# Patient Record
Sex: Male | Born: 1950 | Race: Black or African American | Hispanic: No | Marital: Married | Smoking: Never smoker
Health system: Southern US, Community
[De-identification: ages and names within clinical notes are randomized; demographics above are authoritative.]

## PROBLEM LIST (undated history)

## (undated) DIAGNOSIS — I10 Essential (primary) hypertension: Secondary | ICD-10-CM

## (undated) DIAGNOSIS — E119 Type 2 diabetes mellitus without complications: Secondary | ICD-10-CM

## (undated) DIAGNOSIS — E785 Hyperlipidemia, unspecified: Secondary | ICD-10-CM

## (undated) DIAGNOSIS — R972 Elevated prostate specific antigen [PSA]: Secondary | ICD-10-CM

## (undated) HISTORY — PX: OTHER SURGICAL HISTORY: SHX169

---

## 2002-04-17 ENCOUNTER — Emergency Department (HOSPITAL_COMMUNITY): Admission: EM | Admit: 2002-04-17 | Discharge: 2002-04-17 | Payer: Self-pay | Admitting: Emergency Medicine

## 2011-10-20 ENCOUNTER — Other Ambulatory Visit: Payer: Self-pay | Admitting: Specialist

## 2011-10-20 DIAGNOSIS — M25511 Pain in right shoulder: Secondary | ICD-10-CM

## 2011-10-21 ENCOUNTER — Ambulatory Visit
Admission: RE | Admit: 2011-10-21 | Discharge: 2011-10-21 | Disposition: A | Discharge status: Home / Self Care | Payer: BC Managed Care – PPO | Source: Ambulatory Visit | Attending: Specialist | Admitting: Specialist

## 2011-10-21 ENCOUNTER — Other Ambulatory Visit: Payer: Self-pay | Admitting: Specialist

## 2011-10-21 DIAGNOSIS — M25511 Pain in right shoulder: Secondary | ICD-10-CM

## 2016-02-17 DIAGNOSIS — E1165 Type 2 diabetes mellitus with hyperglycemia: Secondary | ICD-10-CM | POA: Diagnosis not present

## 2016-02-17 DIAGNOSIS — E119 Type 2 diabetes mellitus without complications: Secondary | ICD-10-CM | POA: Diagnosis not present

## 2016-02-17 DIAGNOSIS — I119 Hypertensive heart disease without heart failure: Secondary | ICD-10-CM | POA: Diagnosis not present

## 2016-02-17 DIAGNOSIS — N400 Enlarged prostate without lower urinary tract symptoms: Secondary | ICD-10-CM | POA: Diagnosis not present

## 2016-03-04 DIAGNOSIS — F4320 Adjustment disorder, unspecified: Secondary | ICD-10-CM | POA: Diagnosis not present

## 2016-04-08 DIAGNOSIS — F4320 Adjustment disorder, unspecified: Secondary | ICD-10-CM | POA: Diagnosis not present

## 2016-07-30 DIAGNOSIS — H524 Presbyopia: Secondary | ICD-10-CM | POA: Diagnosis not present

## 2016-09-08 DIAGNOSIS — F4320 Adjustment disorder, unspecified: Secondary | ICD-10-CM | POA: Diagnosis not present

## 2017-06-29 DIAGNOSIS — E119 Type 2 diabetes mellitus without complications: Secondary | ICD-10-CM | POA: Diagnosis not present

## 2017-06-29 DIAGNOSIS — I119 Hypertensive heart disease without heart failure: Secondary | ICD-10-CM | POA: Diagnosis not present

## 2017-06-29 DIAGNOSIS — Z794 Long term (current) use of insulin: Secondary | ICD-10-CM | POA: Diagnosis not present

## 2017-08-03 DIAGNOSIS — T8140XA Infection following a procedure, unspecified, initial encounter: Secondary | ICD-10-CM | POA: Diagnosis not present

## 2017-10-12 DIAGNOSIS — I119 Hypertensive heart disease without heart failure: Secondary | ICD-10-CM | POA: Diagnosis not present

## 2017-10-12 DIAGNOSIS — R739 Hyperglycemia, unspecified: Secondary | ICD-10-CM | POA: Diagnosis not present

## 2017-10-12 DIAGNOSIS — E119 Type 2 diabetes mellitus without complications: Secondary | ICD-10-CM | POA: Diagnosis not present

## 2018-05-17 DIAGNOSIS — D485 Neoplasm of uncertain behavior of skin: Secondary | ICD-10-CM | POA: Diagnosis not present

## 2018-05-17 DIAGNOSIS — L980 Pyogenic granuloma: Secondary | ICD-10-CM | POA: Diagnosis not present

## 2018-06-14 DIAGNOSIS — L980 Pyogenic granuloma: Secondary | ICD-10-CM | POA: Diagnosis not present

## 2018-06-14 DIAGNOSIS — D485 Neoplasm of uncertain behavior of skin: Secondary | ICD-10-CM | POA: Diagnosis not present

## 2018-07-12 DIAGNOSIS — R739 Hyperglycemia, unspecified: Secondary | ICD-10-CM | POA: Diagnosis not present

## 2018-07-12 DIAGNOSIS — E119 Type 2 diabetes mellitus without complications: Secondary | ICD-10-CM | POA: Diagnosis not present

## 2018-07-12 DIAGNOSIS — I119 Hypertensive heart disease without heart failure: Secondary | ICD-10-CM | POA: Diagnosis not present

## 2018-07-13 DIAGNOSIS — E034 Atrophy of thyroid (acquired): Secondary | ICD-10-CM | POA: Diagnosis not present

## 2018-07-13 DIAGNOSIS — E7849 Other hyperlipidemia: Secondary | ICD-10-CM | POA: Diagnosis not present

## 2018-07-13 DIAGNOSIS — E119 Type 2 diabetes mellitus without complications: Secondary | ICD-10-CM | POA: Diagnosis not present

## 2018-07-13 DIAGNOSIS — I10 Essential (primary) hypertension: Secondary | ICD-10-CM | POA: Diagnosis not present

## 2019-02-07 DIAGNOSIS — I119 Hypertensive heart disease without heart failure: Secondary | ICD-10-CM | POA: Diagnosis not present

## 2019-02-07 DIAGNOSIS — E119 Type 2 diabetes mellitus without complications: Secondary | ICD-10-CM | POA: Diagnosis not present

## 2019-02-07 DIAGNOSIS — R739 Hyperglycemia, unspecified: Secondary | ICD-10-CM | POA: Diagnosis not present

## 2019-02-08 DIAGNOSIS — E034 Atrophy of thyroid (acquired): Secondary | ICD-10-CM | POA: Diagnosis not present

## 2019-02-08 DIAGNOSIS — E119 Type 2 diabetes mellitus without complications: Secondary | ICD-10-CM | POA: Diagnosis not present

## 2019-02-08 DIAGNOSIS — I10 Essential (primary) hypertension: Secondary | ICD-10-CM | POA: Diagnosis not present

## 2019-02-08 DIAGNOSIS — E7849 Other hyperlipidemia: Secondary | ICD-10-CM | POA: Diagnosis not present

## 2019-04-25 ENCOUNTER — Other Ambulatory Visit: Payer: Self-pay

## 2019-08-21 ENCOUNTER — Other Ambulatory Visit: Payer: Self-pay

## 2019-09-05 DIAGNOSIS — E119 Type 2 diabetes mellitus without complications: Secondary | ICD-10-CM | POA: Diagnosis not present

## 2019-09-05 DIAGNOSIS — I119 Hypertensive heart disease without heart failure: Secondary | ICD-10-CM | POA: Diagnosis not present

## 2019-09-05 DIAGNOSIS — R739 Hyperglycemia, unspecified: Secondary | ICD-10-CM | POA: Diagnosis not present

## 2019-09-05 DIAGNOSIS — D638 Anemia in other chronic diseases classified elsewhere: Secondary | ICD-10-CM | POA: Diagnosis not present

## 2019-09-05 DIAGNOSIS — Z0000 Encounter for general adult medical examination without abnormal findings: Secondary | ICD-10-CM | POA: Diagnosis not present

## 2020-03-05 ENCOUNTER — Ambulatory Visit: Payer: Self-pay | Admitting: Internal Medicine

## 2020-05-21 ENCOUNTER — Ambulatory Visit: Payer: Self-pay | Admitting: Internal Medicine

## 2020-05-28 ENCOUNTER — Other Ambulatory Visit: Payer: Self-pay

## 2020-05-28 MED ORDER — LOSARTAN POTASSIUM-HCTZ 100-12.5 MG PO TABS: 1.0000 | ORAL_TABLET | Freq: Every day | ORAL | 3 refills | Status: DC

## 2020-07-16 ENCOUNTER — Ambulatory Visit: Payer: Self-pay | Admitting: Family Medicine

## 2020-08-13 ENCOUNTER — Ambulatory Visit: Payer: Self-pay | Admitting: Family Medicine

## 2020-08-18 ENCOUNTER — Encounter: Payer: Self-pay | Admitting: Pharmacist

## 2020-09-04 ENCOUNTER — Other Ambulatory Visit: Payer: Self-pay | Admitting: *Deleted

## 2020-09-04 MED ORDER — GLIMEPIRIDE 4 MG PO TABS: 8.0000 mg | ORAL_TABLET | Freq: Every day | ORAL | 3 refills | Status: DC

## 2020-09-10 ENCOUNTER — Ambulatory Visit (INDEPENDENT_AMBULATORY_CARE_PROVIDER_SITE_OTHER): Payer: PPO | Admitting: Family Medicine

## 2020-09-10 ENCOUNTER — Encounter: Payer: Self-pay | Admitting: Family Medicine

## 2020-09-10 ENCOUNTER — Other Ambulatory Visit: Payer: Self-pay

## 2020-09-10 VITALS — BP 153/81 | HR 83 | Ht 72.0 in | Wt 169.9 lb

## 2020-09-10 DIAGNOSIS — Z0000 Encounter for general adult medical examination without abnormal findings: Secondary | ICD-10-CM | POA: Diagnosis not present

## 2020-09-10 DIAGNOSIS — Z1211 Encounter for screening for malignant neoplasm of colon: Secondary | ICD-10-CM

## 2020-09-10 DIAGNOSIS — Z23 Encounter for immunization: Secondary | ICD-10-CM | POA: Diagnosis not present

## 2020-09-10 DIAGNOSIS — S60512A Abrasion of left hand, initial encounter: Secondary | ICD-10-CM

## 2020-09-10 DIAGNOSIS — E119 Type 2 diabetes mellitus without complications: Secondary | ICD-10-CM | POA: Insufficient documentation

## 2020-09-10 DIAGNOSIS — I10 Essential (primary) hypertension: Secondary | ICD-10-CM | POA: Diagnosis not present

## 2020-09-10 DIAGNOSIS — R972 Elevated prostate specific antigen [PSA]: Secondary | ICD-10-CM | POA: Diagnosis not present

## 2020-09-10 DIAGNOSIS — E7849 Other hyperlipidemia: Secondary | ICD-10-CM

## 2020-09-10 DIAGNOSIS — E785 Hyperlipidemia, unspecified: Secondary | ICD-10-CM | POA: Insufficient documentation

## 2020-09-10 LAB — GLUCOSE, POCT (MANUAL RESULT ENTRY): POC Glucose: 226 mg/dl — AB (ref 70–99)

## 2020-09-10 MED ORDER — METFORMIN HCL 500 MG PO TABS: 500.0000 mg | ORAL_TABLET | Freq: Two times a day (BID) | ORAL | 3 refills | Status: DC

## 2020-09-10 MED ORDER — ROSUVASTATIN CALCIUM 10 MG PO TABS: 10.0000 mg | ORAL_TABLET | Freq: Every day | ORAL | 3 refills | Status: DC

## 2020-09-10 MED ORDER — LOSARTAN POTASSIUM-HCTZ 100-12.5 MG PO TABS: 1.0000 | ORAL_TABLET | Freq: Every day | ORAL | 3 refills | Status: DC

## 2020-09-10 MED ORDER — FINASTERIDE 5 MG PO TABS: 5.0000 mg | ORAL_TABLET | Freq: Every day | ORAL | 3 refills | Status: DC

## 2020-09-10 NOTE — Assessment & Plan Note (Signed)
Crestor 10 mg ordered today, mild lipidemia but with multiple co morbidities treatment initiated.

## 2020-09-10 NOTE — Assessment & Plan Note (Signed)
Colon Screening- Will order Cologuard PSA- 2020 abnormal doing it today TDAP- Doing today 2021 Shingles Vaccine- Declined Flu Vaccine- 2021 Pneumonia Vaccine- Declined

## 2020-09-10 NOTE — Assessment & Plan Note (Signed)
Mild abrasion left hand, unsure of date of injury, updating TDAP today

## 2020-09-10 NOTE — Assessment & Plan Note (Addendum)
Diabetes mellitus Type II, under excellent control.. Continued metformin; see  medication orders.  A1C done today, diet discussed, statin therapy initiated at Crestor 10 mg.

## 2020-09-10 NOTE — Progress Notes (Signed)
Established Patient Office Visit  SUBJECTIVE:  Subjective  Patient ID: Lucas Collins, male    DOB: [birth year]  Age: 69 y.o. MRN: [id number]  CC:  Chief Complaint  Patient presents with  . Diabetes    HPI Lucas Collins is a 69 y.o. male presenting today for Annual exam, PSA, DM and HTN management.   History reviewed. No pertinent past medical history.  History reviewed. No pertinent surgical history.  History reviewed. No pertinent family history.  Social History   Socioeconomic History  . Marital status: Married    Spouse name: Not on file  . Number of children: Not on file  . Years of education: Not on file  . Highest education level: Not on file  Occupational History  . Not on file  Tobacco Use  . Smoking status: Never Smoker  . Smokeless tobacco: Never Used  Substance and Sexual Activity  . Alcohol use: Never  . Drug use: Never  . Sexual activity: Yes  Other Topics Concern  . Not on file  Social History Narrative  . Not on file   Social Determinants of Health   Financial Resource Strain: Not on file  Food Insecurity: Not on file  Transportation Needs: Not on file  Physical Activity: Not on file  Stress: Not on file  Social Connections: Not on file  Intimate Partner Violence: Not on file     Current Outpatient Medications:  .  finasteride (PROSCAR) 5 MG tablet, Take 1 tablet (5 mg total) by mouth daily., Disp: 90 tablet, Rfl: 3 .  glimepiride (AMARYL) 4 MG tablet, Take 2 tablets (8 mg total) by mouth daily., Disp: 90 tablet, Rfl: 3 .  losartan-hydrochlorothiazide (HYZAAR) 100-12.5 MG tablet, Take 1 tablet by mouth daily., Disp: 90 tablet, Rfl: 3 .  metFORMIN (GLUCOPHAGE) 500 MG tablet, Take 1 tablet (500 mg total) by mouth 2 (two) times daily., Disp: 180 tablet, Rfl: 3 .  rosuvastatin (CRESTOR) 10 MG tablet, Take 1 tablet (10 mg total) by mouth daily., Disp: 90 tablet, Rfl: 3   Not on File  ROS Review of Systems  Constitutional: Negative.    Respiratory: Negative.   Cardiovascular: Negative.   Genitourinary: Negative.   Skin: Negative.   Neurological: Negative.   Psychiatric/Behavioral: Negative.      OBJECTIVE:    Physical Exam Constitutional:      Appearance: Normal appearance.  HENT:     Right Ear: Tympanic membrane normal.     Left Ear: Tympanic membrane normal.     Mouth/Throat:     Mouth: Mucous membranes are moist.  Eyes:     Pupils: Pupils are equal, round, and reactive to light.  Cardiovascular:     Rate and Rhythm: Normal rate.  Pulmonary:     Effort: Pulmonary effort is normal.  Musculoskeletal:        General: Normal range of motion.     Cervical back: Normal range of motion.  Skin:    General: Skin is warm.  Neurological:     Mental Status: He is alert.  Psychiatric:        Mood and Affect: Mood normal.     BP (!) 153/81   Pulse 83   Ht 6' (1.829 m)   Wt 169 lb 14.4 oz (77.1 kg)   BMI 23.04 kg/m  Wt Readings from Last 3 Encounters:  09/10/20 169 lb 14.4 oz (77.1 kg)    Health Maintenance Due  Topic Date Due  . HEMOGLOBIN A1C  Never done  .  Hepatitis C Screening  Never done  . FOOT EXAM  Never done  . OPHTHALMOLOGY EXAM  Never done  . COVID-19 Vaccine (1) Never done  . TETANUS/TDAP  Never done  . COLONOSCOPY  Never done  . PNA vac Low Risk Adult (1 of 2 - PCV13) Never done  . INFLUENZA VACCINE  04/26/2020    There are no preventive care reminders to display for this patient.  No flowsheet data found. No flowsheet data found.  No results found for: TSH No results found for: ALBUMIN, ANIONGAP, EGFR, GFR No results found for: CHOL, HDL, LDLCALC, CHOLHDL No results found for: TRIG No results found for: HGBA1C    ASSESSMENT & PLAN:   Problem List Items Addressed This Visit      Cardiovascular and Mediastinum   Primary hypertension   Relevant Medications   losartan-hydrochlorothiazide (HYZAAR) 100-12.5 MG tablet   rosuvastatin (CRESTOR) 10 MG tablet   Other Relevant  Orders   CBC with Differential/Platelet   COMPLETE METABOLIC PANEL WITH GFR     Endocrine   Type 2 diabetes mellitus without complication, without long-term current use of insulin (HCC)    Diabetes mellitus Type II, under excellent control.. Continued metformin; see  medication orders.  A1C done today, diet discussed, statin therapy initiated at Crestor 10 mg.        Relevant Medications   losartan-hydrochlorothiazide (HYZAAR) 100-12.5 MG tablet   metFORMIN (GLUCOPHAGE) 500 MG tablet   rosuvastatin (CRESTOR) 10 MG tablet   Other Relevant Orders   POCT glucose (manual entry) (Completed)   Hemoglobin A1c     Musculoskeletal and Integument   Abrasion of left hand    Mild abrasion left hand, unsure of date of injury, updating TDAP today      Relevant Orders   Tdap vaccine greater than or equal to 7yo IM     Other   Annual physical exam - Primary    Colon Screening- Will order Cologuard PSA- 2020 abnormal doing it today TDAP- Doing today 2021 Shingles Vaccine- Declined Flu Vaccine- 2021 Pneumonia Vaccine- Declined       Relevant Orders   CBC with Differential/Platelet   COMPLETE METABOLIC PANEL WITH GFR   Lipid panel   Hemoglobin A1c   PSA   Elevated PSA    Last PSA 18 months ago was 10.4, he has consulted with Urology then and biopsy was not suggested at that time. Plan- PSA done today, he will call Urology for a f/u.       Relevant Orders   PSA    Other Visit Diagnoses    Need for influenza vaccination       Relevant Orders   Flu Vaccine QUAD High Dose(Fluad)      Meds ordered this encounter  Medications  . losartan-hydrochlorothiazide (HYZAAR) 100-12.5 MG tablet    Sig: Take 1 tablet by mouth daily.    Dispense:  90 tablet    Refill:  3  . finasteride (PROSCAR) 5 MG tablet    Sig: Take 1 tablet (5 mg total) by mouth daily.    Dispense:  90 tablet    Refill:  3  . metFORMIN (GLUCOPHAGE) 500 MG tablet    Sig: Take 1 tablet (500 mg total) by mouth 2  (two) times daily.    Dispense:  180 tablet    Refill:  3  . rosuvastatin (CRESTOR) 10 MG tablet    Sig: Take 1 tablet (10 mg total) by mouth daily.    Dispense:  90 tablet    Refill:  3      Follow-up: No follow-ups on file.    Beckie Salts, [place] [address] [place], [place] [address]

## 2020-09-10 NOTE — Assessment & Plan Note (Signed)
Not at goal today, will maintain current therapy and return for re evaluation

## 2020-09-10 NOTE — Assessment & Plan Note (Signed)
Last PSA 18 months ago was 10.4, he has consulted with Urology then and biopsy was not suggested at that time. Plan- PSA done today, he will call Urology for a f/u.

## 2020-09-11 LAB — CBC WITH DIFFERENTIAL/PLATELET
Absolute Monocytes: 353 cells/uL (ref 200–950)
Basophils Absolute: 30 cells/uL (ref 0–200)
Basophils Relative: 0.4 %
Eosinophils Absolute: 68 cells/uL (ref 15–500)
Eosinophils Relative: 0.9 %
HCT: 36.5 % — ABNORMAL LOW (ref 38.5–50.0)
Hemoglobin: 11.9 g/dL — ABNORMAL LOW (ref 13.2–17.1)
Lymphs Abs: 1290 cells/uL (ref 850–3900)
MCH: 29.2 pg (ref 27.0–33.0)
MCHC: 32.6 g/dL (ref 32.0–36.0)
MCV: 89.5 fL (ref 80.0–100.0)
MPV: 9.9 fL (ref 7.5–12.5)
Monocytes Relative: 4.7 %
Neutro Abs: 5760 cells/uL (ref 1500–7800)
Neutrophils Relative %: 76.8 %
Platelets: 291 10*3/uL (ref 140–400)
RBC: 4.08 10*6/uL — ABNORMAL LOW (ref 4.20–5.80)
RDW: 12.4 % (ref 11.0–15.0)
Total Lymphocyte: 17.2 %
WBC: 7.5 10*3/uL (ref 3.8–10.8)

## 2020-09-11 LAB — COMPLETE METABOLIC PANEL WITH GFR
AG Ratio: 1.9 (calc) (ref 1.0–2.5)
ALT: 17 U/L (ref 9–46)
AST: 18 U/L (ref 10–35)
Albumin: 4.3 g/dL (ref 3.6–5.1)
Alkaline phosphatase (APISO): 55 U/L (ref 35–144)
BUN/Creatinine Ratio: 35 (calc) — ABNORMAL HIGH (ref 6–22)
BUN: 29 mg/dL — ABNORMAL HIGH (ref 7–25)
CO2: 27 mmol/L (ref 20–32)
Calcium: 9.8 mg/dL (ref 8.6–10.3)
Chloride: 106 mmol/L (ref 98–110)
Creat: 0.82 mg/dL (ref 0.70–1.25)
GFR, Est African American: 105 mL/min/{1.73_m2} (ref >=60)
GFR, Est Non African American: 90 mL/min/{1.73_m2} (ref >=60)
Globulin: 2.3 g/dL (calc) (ref 1.9–3.7)
Glucose, Bld: 183 mg/dL — ABNORMAL HIGH (ref 65–99)
Potassium: 4.6 mmol/L (ref 3.5–5.3)
Sodium: 143 mmol/L (ref 135–146)
Total Bilirubin: 0.2 mg/dL (ref 0.2–1.2)
Total Protein: 6.6 g/dL (ref 6.1–8.1)

## 2020-09-11 LAB — LIPID PANEL
Cholesterol: 168 mg/dL (ref <200)
HDL: 57 mg/dL (ref >=40)
LDL Cholesterol (Calc): 96 mg/dL (calc)
Non-HDL Cholesterol (Calc): 111 mg/dL (calc) (ref <130)
Total CHOL/HDL Ratio: 2.9 (calc) (ref <5.0)
Triglycerides: 63 mg/dL (ref <150)

## 2020-09-11 LAB — PSA: PSA: 16.43 ng/mL — ABNORMAL HIGH (ref <=4.0)

## 2020-09-11 LAB — HEMOGLOBIN A1C
Hgb A1c MFr Bld: 7.6 % of total Hgb — ABNORMAL HIGH (ref <5.7)
Mean Plasma Glucose: 171 mg/dL
eAG (mmol/L): 9.5 mmol/L

## 2020-10-20 ENCOUNTER — Other Ambulatory Visit: Payer: Self-pay | Admitting: Family Medicine

## 2021-02-26 ENCOUNTER — Other Ambulatory Visit: Payer: Self-pay | Admitting: Family Medicine

## 2021-04-08 DIAGNOSIS — G5601 Carpal tunnel syndrome, right upper limb: Secondary | ICD-10-CM | POA: Diagnosis not present

## 2021-05-13 ENCOUNTER — Other Ambulatory Visit: Payer: Self-pay | Admitting: *Deleted

## 2021-05-13 ENCOUNTER — Ambulatory Visit (INDEPENDENT_AMBULATORY_CARE_PROVIDER_SITE_OTHER): Payer: PPO | Admitting: *Deleted

## 2021-05-13 ENCOUNTER — Other Ambulatory Visit: Payer: Self-pay

## 2021-05-13 DIAGNOSIS — Z0000 Encounter for general adult medical examination without abnormal findings: Secondary | ICD-10-CM | POA: Diagnosis not present

## 2021-05-13 DIAGNOSIS — Z1211 Encounter for screening for malignant neoplasm of colon: Secondary | ICD-10-CM

## 2021-05-13 MED ORDER — ROSUVASTATIN CALCIUM 10 MG PO TABS: 10.0000 mg | ORAL_TABLET | Freq: Every day | ORAL | 3 refills | Status: DC

## 2021-05-13 MED ORDER — METFORMIN HCL 500 MG PO TABS: 500.0000 mg | ORAL_TABLET | Freq: Two times a day (BID) | ORAL | 3 refills | Status: DC

## 2021-05-13 MED ORDER — CIPROFLOXACIN HCL 500 MG PO TABS: 500.0000 mg | ORAL_TABLET | Freq: Two times a day (BID) | ORAL | 0 refills | Status: AC

## 2021-05-13 MED ORDER — DULOXETINE HCL 30 MG PO CPEP: 30.0000 mg | ORAL_CAPSULE | Freq: Every day | ORAL | 3 refills | Status: DC

## 2021-05-13 MED ORDER — FINASTERIDE 5 MG PO TABS: 5.0000 mg | ORAL_TABLET | Freq: Every day | ORAL | 3 refills | Status: DC

## 2021-05-13 MED ORDER — TRETINOIN 0.025 % EX CREA: TOPICAL_CREAM | Freq: Every day | CUTANEOUS | 3 refills | Status: DC

## 2021-05-13 MED ORDER — METHYLPREDNISOLONE 4 MG PO TBPK: ORAL_TABLET | ORAL | 0 refills | Status: AC

## 2021-05-13 NOTE — Progress Notes (Signed)
Subjective:   Lucas Collins is a 70 y.o. male who presents for Medicare Annual/Subsequent preventive examination. Visit performed using audio.  Patient:work Provider:home    Review of Systems    Deferred to provider        Objective:    Today's Vitals   05/13/21 1031 05/13/21 1042  PainSc: 1  1    There is no height or weight on file to calculate BMI.  Advanced Directives 05/13/2021  Does Patient Have a Medical Advance Directive? No  Would patient like information on creating a medical advance directive? No - Patient declined    Current Medications (verified) Outpatient Encounter Medications as of 05/13/2021  Medication Sig   DULoxetine (CYMBALTA) 30 MG capsule Take 30 mg by mouth daily.   finasteride (PROSCAR) 5 MG tablet Take 1 tablet (5 mg total) by mouth daily.   glimepiride (AMARYL) 4 MG tablet TAKE 2 TABLETS(8 MG) BY MOUTH DAILY   losartan-hydrochlorothiazide (HYZAAR) 100-12.5 MG tablet Take 1 tablet by mouth daily.   metFORMIN (GLUCOPHAGE) 500 MG tablet Take 1 tablet (500 mg total) by mouth 2 (two) times daily.   rosuvastatin (CRESTOR) 10 MG tablet Take 1 tablet (10 mg total) by mouth daily.   No facility-administered encounter medications on file as of 05/13/2021.    Allergies (verified) Patient has no known allergies.   History: History reviewed. No pertinent past medical history. History reviewed. No pertinent surgical history. History reviewed. No pertinent family history. Social History   Socioeconomic History   Marital status: Married    Spouse name: Not on file   Number of children: Not on file   Years of education: Not on file   Highest education level: Not on file  Occupational History   Not on file  Tobacco Use   Smoking status: Never   Smokeless tobacco: Never  Substance and Sexual Activity   Alcohol use: Never   Drug use: Never   Sexual activity: Yes  Other Topics Concern   Not on file  Social History Narrative   Not on file    Social Determinants of Health   Financial Resource Strain: Low Risk    Difficulty of Paying Living Expenses: Not hard at all  Food Insecurity: No Food Insecurity   Worried About Running Out of Food in the Last Year: Never true   [place] in the Last Year: Never true  Transportation Needs: No Transportation Needs   Lack of Transportation (Medical): No   Lack of Transportation (Non-Medical): No  Physical Activity: Sufficiently Active   Days of Exercise per Week: 5 days   Minutes of Exercise per Session: 30 min  Stress: No Stress Concern Present   Feeling of Stress : Not at all  Social Connections: Socially Integrated   Frequency of Communication with Friends and Family: More than three times a week   Frequency of Social Gatherings with Friends and Family: More than three times a week   Attends Religious Services: More than 4 times per year   Active Member of [employer] or Organizations: Yes   Attends Music therapist: More than 4 times per year   Marital Status: Married    Tobacco Counseling Counseling given: Not Answered   Clinical Intake:  Pre-visit preparation completed: Yes  Pain : 0-10 Pain Score: 1  Pain Type: Other (Comment) (carpal tunnell of right hand) Pain Location: Hand Pain Orientation: Right Pain Descriptors / Indicators: Aching Pain Onset: More than a month ago Pain Frequency: Occasional  Pain Relieving Factors: patient taking cymbalta to help with symptoms  Pain Relieving Factors: patient taking cymbalta to help with symptoms  Nutritional Status: BMI of 19-24  Normal Nutritional Risks: None Diabetes: Yes CBG done?: No Did pt. bring in CBG monitor from home?: No  How often do you need to have someone help you when you read instructions, pamphlets, or other written materials from your doctor or pharmacy?: 1 - Never What is the last grade level you completed in school?: 12  Diabetic?yes  Interpreter Needed?: No  Information entered  by :: Lacretia Nicks   Activities of Daily Living No flowsheet data found.  Patient Care Team: Cletis Athens, MD as PCP - General (Internal Medicine)  Indicate any recent Medical Services you may have received from other than [facility] providers in the past year (date may be approximate).     Assessment:   This is a routine wellness examination for Lucas Collins.  Hearing/Vision screen No results found.  Dietary issues and exercise activities discussed:     Goals Addressed   None    Depression Screen PHQ 2/9 Scores 05/13/2021  PHQ - 2 Score 0    Fall Risk Fall Risk  05/13/2021 08/21/2019 04/25/2019  Falls in the past year? 0 0 (No Data)  Comment - Emmi Telephone Survey: data to providers prior to load Emmi Telephone Survey: data to providers prior to load  Number falls in past yr: 0 - (No Data)  Comment - - Emmi Telephone Survey Actual Response =   Injury with Fall? 0 - -  Risk for fall due to : No Fall Risks - -  Follow up Falls evaluation completed - -    FALL RISK PREVENTION PERTAINING TO THE HOME:  Any stairs in or around the home? Yes  If so, are there any without handrails? No  Home free of loose throw rugs in walkways, pet beds, electrical cords, etc? No  Adequate lighting in your home to reduce risk of falls? Yes   ASSISTIVE DEVICES UTILIZED TO PREVENT FALLS:  Life alert? No  Use of a cane, walker or w/c? No  Grab bars in the bathroom? No  Shower chair or bench in shower? No  Elevated toilet seat or a handicapped toilet? No   TIMED UP AND GO:  Was the test performed? No .  Length of time to ambulate 10 feet:  sec.   Gait steady and fast without use of assistive device  Cognitive Function: MMSE - Mini Mental State Exam 05/13/2021  Orientation to time 5  Orientation to Place 5  Registration 3  Attention/ Calculation 5  Recall 3  Language- name 2 objects 2  Language- repeat 1  Language- follow 3 step command 3  Language- read & follow direction 1  Write  a sentence 1  Copy design 1  Total score 30     6CIT Screen 05/13/2021  What Year? 0 points  What month? 0 points  What time? 0 points  Count back from 20 0 points  Months in reverse 0 points  Repeat phrase 0 points  Total Score 0    Immunizations Immunization History  Administered Date(s) Administered   Fluad Quad(high Dose 65+) 09/10/2020   Hepatitis A 05/18/2007, 08/03/2017   Hepatitis B 05/18/2007, 08/03/2017   PFIZER(Purple Top)SARS-COV-2 Vaccination 11/28/2019, 12/20/2019, 07/30/2020, 01/26/2021   Td 05/18/2007, 08/03/2017   Tdap 09/10/2020    TDAP status: Up to date  Flu Vaccine status: Up to date  Pneumococcal vaccine status: Due,  Education has been provided regarding the importance of this vaccine. Advised may receive this vaccine at local pharmacy or Health Dept. Aware to provide a copy of the vaccination record if obtained from local pharmacy or Health Dept. Verbalized acceptance and understanding.  Covid-19 vaccine status: Completed vaccines  Qualifies for Shingles Vaccine? Yes   Zostavax completed No   Shingrix Completed?: No.    Education has been provided regarding the importance of this vaccine. Patient has been advised to call insurance company to determine out of pocket expense if they have not yet received this vaccine. Advised may also receive vaccine at local pharmacy or Health Dept. Verbalized acceptance and understanding.  Screening Tests Health Maintenance  Topic Date Due   FOOT EXAM  Never done   OPHTHALMOLOGY EXAM  Never done   Hepatitis C Screening  Never done   Zoster Vaccines- Shingrix (1 of 2) Never done   PNA vac Low Risk Adult (1 of 2 - PCV13) Never done   HEMOGLOBIN A1C  03/11/2021   INFLUENZA VACCINE  04/26/2021   COLONOSCOPY (Pts 45-2yr Insurance coverage will need to be confirmed)  09/10/2030   TETANUS/TDAP  09/10/2030   COVID-19 Vaccine  Completed   HPV VACCINES  Aged Out    Health Maintenance  Health Maintenance Due   Topic Date Due   FOOT EXAM  Never done   OPHTHALMOLOGY EXAM  Never done   Hepatitis C Screening  Never done   Zoster Vaccines- Shingrix (1 of 2) Never done   PNA vac Low Risk Adult (1 of 2 - PCV13) Never done   HEMOGLOBIN A1C  03/11/2021   INFLUENZA VACCINE  04/26/2021    Colorectal cancer screening: Referral to GI placed  . Pt aware the office will call re: appt.  Lung Cancer Screening: (Low Dose CT Chest recommended if Age 70-80years, 30 pack-year currently smoking OR have quit w/in 15years.) does not qualify.     Additional Screening:  Hepatitis C Screening: does qualify  Vision Screening: Recommended annual ophthalmology exams for early detection of glaucoma and other disorders of the eye. Is the patient up to date with their annual eye exam?  Yes  Who is the provider or what is the name of the office in which the patient attends annual eye exams? [facility] If pt is not established with a provider, would they like to be referred to a provider to establish care? No .   Dental Screening: Recommended annual dental exams for proper oral hygiene  Community Resource Referral / Chronic Care Management: CRR required this visit?  No   CCM required this visit?  No      Plan:     I have personally reviewed and noted the following in the patient's chart:   Medical and social history Use of alcohol, tobacco or illicit drugs  Current medications and supplements including opioid prescriptions. Patient is not currently taking opioid prescriptions. Functional ability and status Nutritional status Physical activity Advanced directives List of other physicians Hospitalizations, surgeries, and ER visits in previous 12 months Vitals Screenings to include cognitive, depression, and falls Referrals and appointments  In addition, I have reviewed and discussed with patient certain preventive protocols, quality metrics, and best practice recommendations. A written personalized  care plan for preventive services as well as general preventive health recommendations were provided to patient.  Colonoscopy ordered and medications refilled.    ALacretia Nicks [place]  05/13/2021   Nurse Notes:  Mr. CHutson, Thank you  for taking time to come for your Medicare Wellness Visit. I appreciate your ongoing commitment to your health goals. Please review the following plan we discussed and let me know if I can assist you in the future.   These are the goals we discussed:  Goals   None     This is a list of the screening recommended for you and due dates:  Health Maintenance  Topic Date Due   Complete foot exam   Never done   Eye exam for diabetics  Never done   Hepatitis C Screening: USPSTF Recommendation to screen - Ages 33-79 yo.  Never done   Zoster (Shingles) Vaccine (1 of 2) Never done   Pneumonia vaccines (1 of 2 - PCV13) Never done   Hemoglobin A1C  03/11/2021   Flu Shot  04/26/2021   Colon Cancer Screening  09/10/2030   Tetanus Vaccine  09/10/2030   COVID-19 Vaccine  Completed   HPV Vaccine  Aged Out

## 2021-05-19 ENCOUNTER — Other Ambulatory Visit: Payer: Self-pay | Admitting: *Deleted

## 2021-05-19 MED ORDER — TRETINOIN 0.025 % EX CREA: TOPICAL_CREAM | Freq: Every day | CUTANEOUS | 3 refills | Status: AC

## 2021-05-20 ENCOUNTER — Telehealth: Payer: Self-pay

## 2021-05-20 NOTE — Telephone Encounter (Signed)
Called no answer left voicemail for a call back

## 2021-05-28 DIAGNOSIS — G5601 Carpal tunnel syndrome, right upper limb: Secondary | ICD-10-CM | POA: Diagnosis not present

## 2021-06-24 DIAGNOSIS — M79641 Pain in right hand: Secondary | ICD-10-CM | POA: Diagnosis not present

## 2021-06-24 DIAGNOSIS — G5601 Carpal tunnel syndrome, right upper limb: Secondary | ICD-10-CM | POA: Diagnosis not present

## 2021-06-30 ENCOUNTER — Other Ambulatory Visit: Payer: Self-pay | Admitting: Physician Assistant

## 2021-06-30 ENCOUNTER — Emergency Department (HOSPITAL_COMMUNITY)
Admission: EM | Admit: 2021-06-30 | Discharge: 2021-06-30 | Disposition: A | Discharge status: Home / Self Care | Payer: PPO | Attending: Emergency Medicine | Admitting: Emergency Medicine

## 2021-06-30 ENCOUNTER — Other Ambulatory Visit: Payer: Self-pay

## 2021-06-30 ENCOUNTER — Encounter: Payer: Self-pay | Admitting: Internal Medicine

## 2021-06-30 ENCOUNTER — Ambulatory Visit (INDEPENDENT_AMBULATORY_CARE_PROVIDER_SITE_OTHER): Payer: PPO | Admitting: Internal Medicine

## 2021-06-30 ENCOUNTER — Emergency Department (HOSPITAL_COMMUNITY): Payer: PPO

## 2021-06-30 ENCOUNTER — Encounter (HOSPITAL_COMMUNITY): Payer: Self-pay

## 2021-06-30 VITALS — BP 166/83 | HR 75 | Ht 72.0 in | Wt 172.4 lb

## 2021-06-30 DIAGNOSIS — E876 Hypokalemia: Secondary | ICD-10-CM

## 2021-06-30 DIAGNOSIS — Z7984 Long term (current) use of oral hypoglycemic drugs: Secondary | ICD-10-CM | POA: Diagnosis not present

## 2021-06-30 DIAGNOSIS — R079 Chest pain, unspecified: Secondary | ICD-10-CM | POA: Diagnosis not present

## 2021-06-30 DIAGNOSIS — R972 Elevated prostate specific antigen [PSA]: Secondary | ICD-10-CM | POA: Diagnosis not present

## 2021-06-30 DIAGNOSIS — I10 Essential (primary) hypertension: Secondary | ICD-10-CM | POA: Diagnosis not present

## 2021-06-30 DIAGNOSIS — I208 Other forms of angina pectoris: Secondary | ICD-10-CM | POA: Diagnosis not present

## 2021-06-30 DIAGNOSIS — Z79899 Other long term (current) drug therapy: Secondary | ICD-10-CM | POA: Insufficient documentation

## 2021-06-30 DIAGNOSIS — E1169 Type 2 diabetes mellitus with other specified complication: Secondary | ICD-10-CM | POA: Diagnosis not present

## 2021-06-30 DIAGNOSIS — R600 Localized edema: Secondary | ICD-10-CM | POA: Diagnosis not present

## 2021-06-30 DIAGNOSIS — E119 Type 2 diabetes mellitus without complications: Secondary | ICD-10-CM | POA: Diagnosis not present

## 2021-06-30 DIAGNOSIS — E785 Hyperlipidemia, unspecified: Secondary | ICD-10-CM

## 2021-06-30 DIAGNOSIS — E7849 Other hyperlipidemia: Secondary | ICD-10-CM | POA: Diagnosis not present

## 2021-06-30 DIAGNOSIS — R0789 Other chest pain: Secondary | ICD-10-CM | POA: Insufficient documentation

## 2021-06-30 HISTORY — DX: Hyperlipidemia, unspecified: E78.5

## 2021-06-30 HISTORY — DX: Type 2 diabetes mellitus without complications: E11.9

## 2021-06-30 HISTORY — DX: Essential (primary) hypertension: I10

## 2021-06-30 HISTORY — DX: Elevated prostate specific antigen (PSA): R97.20

## 2021-06-30 LAB — BASIC METABOLIC PANEL
Anion gap: 11 (ref 5–15)
BUN: 23 mg/dL (ref 8–23)
CO2: 27 mmol/L (ref 22–32)
Calcium: 9.8 mg/dL (ref 8.9–10.3)
Chloride: 101 mmol/L (ref 98–111)
Creatinine, Ser: 0.78 mg/dL (ref 0.61–1.24)
GFR, Estimated: >60 mL/min (ref >60)
Glucose, Bld: 148 mg/dL — ABNORMAL HIGH (ref 70–99)
Potassium: 3.5 mmol/L (ref 3.5–5.1)
Sodium: 139 mmol/L (ref 135–145)

## 2021-06-30 LAB — CBC WITH DIFFERENTIAL/PLATELET
Abs Immature Granulocytes: 0.03 10*3/uL (ref 0.00–0.07)
Basophils Absolute: 0 10*3/uL (ref 0.0–0.1)
Basophils Relative: 0 %
Eosinophils Absolute: 0.1 10*3/uL (ref 0.0–0.5)
Eosinophils Relative: 1 %
HCT: 36.9 % — ABNORMAL LOW (ref 39.0–52.0)
Hemoglobin: 12 g/dL — ABNORMAL LOW (ref 13.0–17.0)
Immature Granulocytes: 0 %
Lymphocytes Relative: 21 %
Lymphs Abs: 2.2 10*3/uL (ref 0.7–4.0)
MCH: 29.6 pg (ref 26.0–34.0)
MCHC: 32.5 g/dL (ref 30.0–36.0)
MCV: 90.9 fL (ref 80.0–100.0)
Monocytes Absolute: 0.5 10*3/uL (ref 0.1–1.0)
Monocytes Relative: 5 %
Neutro Abs: 7.9 10*3/uL — ABNORMAL HIGH (ref 1.7–7.7)
Neutrophils Relative %: 73 %
Platelets: 291 10*3/uL (ref 150–400)
RBC: 4.06 MIL/uL — ABNORMAL LOW (ref 4.22–5.81)
RDW: 14.2 % (ref 11.5–15.5)
WBC: 10.8 10*3/uL — ABNORMAL HIGH (ref 4.0–10.5)
nRBC: 0 % (ref 0.0–0.2)

## 2021-06-30 LAB — TROPONIN I (HIGH SENSITIVITY): Troponin I (High Sensitivity): 6 ng/L (ref <18)

## 2021-06-30 MED ORDER — METOPROLOL SUCCINATE ER 25 MG PO TB24
50.0000 mg | ORAL_TABLET | Freq: Every day | ORAL | Status: DC
  Administered 2021-06-30: 50 mg via ORAL
  Filled 2021-06-30: qty 2

## 2021-06-30 MED ORDER — POTASSIUM CHLORIDE CRYS ER 20 MEQ PO TBCR
40.0000 meq | EXTENDED_RELEASE_TABLET | Freq: Once | ORAL | Status: AC
  Administered 2021-06-30: 40 meq via ORAL
  Filled 2021-06-30: qty 2

## 2021-06-30 MED ORDER — NITROGLYCERIN 0.4 MG SL SUBL: 0.4000 mg | SUBLINGUAL_TABLET | SUBLINGUAL | Status: DC | PRN

## 2021-06-30 MED ORDER — METOPROLOL SUCCINATE ER 50 MG PO TB24: 50.0000 mg | ORAL_TABLET | Freq: Every day | ORAL | 2 refills | Status: DC

## 2021-06-30 MED ORDER — POTASSIUM CHLORIDE CRYS ER 10 MEQ PO TBCR: 10.0000 meq | EXTENDED_RELEASE_TABLET | Freq: Every day | ORAL | 1 refills | Status: DC

## 2021-06-30 NOTE — Consult Note (Signed)
Cardiology Consultation:   Patient ID: Lucas Collins MRN: [id number]; DOB: Feb 09, 1951  Admit date: 06/30/2021 Date of Consult: 06/30/2021  PCP:  Cletis Athens, MD   [facility] Providers Cardiologist:  Dr. Martinique     Patient Profile:   Lucas Collins is a 70 y.o. male with a hx of HTN, HLD, DM2, and history of elevated PSA who is being seen 06/30/2021 for the evaluation of chest pain at the request of Dr. Dina Rich.  History of Present Illness:   Lucas Collins Lucas Collins is a pleasant 70 year old male with past medical history of HTN, HLD, DM2, and history of elevated PSA.  Patient denies any prior cardiac history.  Father had MI at age 69.  Mother is in her 85s and has diabetes.  He has never smoked, does not drink and does not do any current illicit drug.  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  He does have history of hypertension and hyperlipidemia.  He takes Hyzaar 100-12.5 mg daily.  His work is very stressful and he does notice that his blood pressure sometimes can go up to 962X on certain days.  In the past 6 weeks, he has been noticing a left anterior dull chest pain radiating to the left subscapular area. Symptom is not associated with the degree of exertion, and often worse when he sees patient in the afternoon. His wife believe the frequency and duration of the symptom has been increasing. He is able to work in his yard without any exertional pain. Due to the concern of his symptom, his wife arranged for him to see his PCP this morning on 06/30/2021. The EKG obtained at PCP's office was concerning for subtle anterior changes, therefore he was referred to [facility], however patient lives in [place] area and decided to come to [facility] instead.   On arrival, SBP was from 150-200 range. He mentions he did take his Hyzaar this morning. WBC borderline elevated at 10.8. Potassium borderline low at 3.5. Cr 0.78 stable. Hs Troponin I at 6 which is negative. Last episode of chest  discomfort lasted from 11AM until 5PM yesterday during work. Cardiology service consulted for chest pain.     Past Medical History:  Diagnosis Date   DM II (diabetes mellitus, type II), controlled ([place])    Elevated PSA    Hyperlipidemia LDL goal <70    Hypertension     Past Surgical History:  Procedure Laterality Date   No Significant past surgery       Home Medications:  Prior to Admission medications   Medication Sig Start Date End Date Taking? Authorizing Provider  DULoxetine (CYMBALTA) 30 MG capsule Take 1 capsule (30 mg total) by mouth daily. 05/13/21  Yes Masoud, Viann Shove, MD  finasteride (PROSCAR) 5 MG tablet Take 1 tablet (5 mg total) by mouth daily. 05/13/21  Yes Masoud, Viann Shove, MD  glimepiride (AMARYL) 4 MG tablet TAKE 2 TABLETS(8 MG) BY MOUTH DAILY Patient taking differently: Take 4 mg by mouth 2 (two) times daily. 02/26/21  Yes Beckie Salts, FNP  losartan-hydrochlorothiazide (HYZAAR) 100-12.5 MG tablet Take 1 tablet by mouth daily. 09/10/20  Yes Beckie Salts, FNP  metFORMIN (GLUCOPHAGE) 500 MG tablet Take 1 tablet (500 mg total) by mouth 2 (two) times daily. Patient taking differently: Take 1,000 mg by mouth 2 (two) times daily. 05/13/21  Yes Masoud, Viann Shove, MD  Multiple Vitamin (MULTI-VITAMIN) tablet Take 1 tablet by mouth daily.   Yes [provider]  Multiple Vitamins-Minerals (PRESERVISION AREDS PO)  Take 2 capsules by mouth 2 (two) times daily.   Yes [provider]  naproxen sodium (ALEVE) 220 MG tablet Take 440 mg by mouth 2 (two) times daily as needed (pain).   Yes [provider]  rosuvastatin (CRESTOR) 10 MG tablet Take 1 tablet (10 mg total) by mouth daily. 05/13/21  Yes Masoud, Viann Shove, MD  tretinoin (RETIN-A) 0.025 % cream Apply topically at bedtime. Apply to acne as needed Patient taking differently: Apply 1 application topically at bedtime as needed (acne). 05/19/21  Yes Masoud, Viann Shove, MD  methylPREDNISolone (MEDROL DOSEPAK) 4 MG TBPK tablet  As directed Patient not taking: No sig reported 05/13/21   Cletis Athens, MD    Inpatient Medications: Scheduled Meds:  Continuous Infusions:  PRN Meds: nitroGLYCERIN  Allergies:   No Known Allergies  Social History:   Social History   Socioeconomic History   Marital status: Married    Spouse name: Not on file   Number of children: Not on file   Years of education: Not on file   Highest education level: Not on file  Occupational History   Not on file  Tobacco Use   Smoking status: Never   Smokeless tobacco: Never  Substance and Sexual Activity   Alcohol use: Never   Drug use: Never   Sexual activity: Yes  Other Topics Concern   Not on file  Social History Narrative   Not on file   Social Determinants of Health   Financial Resource Strain: Low Risk    Difficulty of Paying Living Expenses: Not hard at all  Food Insecurity: No Food Insecurity   Worried About Charity fundraiser in the Last Year: Never true   [place] in the Last Year: Never true  Transportation Needs: No Transportation Needs   Lack of Transportation (Medical): No   Lack of Transportation (Non-Medical): No  Physical Activity: Sufficiently Active   Days of Exercise per Week: 5 days   Minutes of Exercise per Session: 30 min  Stress: No Stress Concern Present   Feeling of Stress : Not at all  Social Connections: Socially Integrated   Frequency of Communication with Friends and Family: More than three times a week   Frequency of Social Gatherings with Friends and Family: More than three times a week   Attends Religious Services: More than 4 times per year   Active Member of [employer] or Organizations: Yes   Attends Music therapist: More than 4 times per year   Marital Status: Married  Human resources officer Violence: Not At Risk   Fear of Current or Ex-Partner: No   Emotionally Abused: No   Physically Abused: No   Sexually Abused: No    Family History:    Family History  Problem  Relation Age of Onset   Diabetes Mother    Heart attack Father      ROS:  Please see the history of present illness.   All other ROS reviewed and negative.     Physical Exam/Data:   Vitals:   06/30/21 1315 06/30/21 1415 06/30/21 1445 06/30/21 1500  BP: (!) 163/93 (!) 165/85 (!) 183/97 (!) 164/101  Pulse: 78 67 72 79  Resp: 14 15  16   Temp:      TempSrc:      SpO2: 99% 100% 100% 100%   No intake or output data in the 24 hours ending 06/30/21 1538 Last 3 Weights 06/30/2021 09/10/2020  Weight (lbs) 172 lb 6.4 oz 169 lb  14.4 oz  Weight (kg) 78.2 kg 77.066 kg     There is no height or weight on file to calculate BMI.  General:  Well nourished, well developed, in no acute distress HEENT: normal Neck: no JVD Vascular: No carotid bruits; Distal pulses 2+ bilaterally Cardiac:  normal S1, S2; RRR; no murmur  Lungs:  clear to auscultation bilaterally, no wheezing, rhonchi or rales  Abd: soft, nontender, no hepatomegaly  Ext: no edema Musculoskeletal:  No deformities, BUE and BLE strength normal and equal Skin: warm and dry  Neuro:  CNs 2-12 intact, no focal abnormalities noted Psych:  Normal affect   EKG:  The EKG was personally reviewed and demonstrates: NSR without significant ST-T wave changes  Telemetry:  Telemetry was personally reviewed and demonstrates:  NSR without significant ventricular ectopy  Relevant CV Studies:  N/A  Laboratory Data:  High Sensitivity Troponin:   Recent Labs  Lab 06/30/21 1337  TROPONINIHS 6     Chemistry Recent Labs  Lab 06/30/21 1337  NA 139  K 3.5  CL 101  CO2 27  GLUCOSE 148*  BUN 23  CREATININE 0.78  CALCIUM 9.8  GFRNONAA >60  ANIONGAP 11    No results for input(s): PROT, ALBUMIN, AST, ALT, ALKPHOS, BILITOT in the last 168 hours. Lipids No results for input(s): CHOL, TRIG, HDL, LABVLDL, LDLCALC, CHOLHDL in the last 168 hours.  Hematology Recent Labs  Lab 06/30/21 1337  WBC 10.8*  RBC 4.06*  HGB 12.0*  HCT 36.9*   MCV 90.9  MCH 29.6  MCHC 32.5  RDW 14.2  PLT 291   Thyroid No results for input(s): TSH, FREET4 in the last 168 hours.  BNPNo results for input(s): BNP, PROBNP in the last 168 hours.  DDimer No results for input(s): DDIMER in the last 168 hours.   Radiology/Studies:  DG Chest Port 1 View  Result Date: 06/30/2021 CLINICAL DATA:  Intermittent chest pain EXAM: PORTABLE CHEST 1 VIEW COMPARISON:  None. FINDINGS: Heart size is normal. Mild tortuosity of the aorta. The lungs are clear. The vascularity is normal. No effusions. Postsurgical changes of the right shoulder. IMPRESSION: No active disease. Electronically Signed   By: Nelson Chimes M.D.   On: 06/30/2021 13:03     Assessment and Plan:   Chest pain  - symptom has been intermittent for 6 weeks, described as dull left sided chest pain radiating to the L scapula  - symptom typically last hours at a time, associated with stress, not with exertion - sent to the hospital today due to possible abnormal EKG. Questionable J point elevated in the anterior leads. Repeat EKG in the [facility] ED showed no significant ST elevation.  - last episode of chest pain was yesterday, first hs trop was negative despite prolonged chest pain yesterday - maybe driven by uncontrolled high blood pressure.  - discussed with MD, will add Toprol XL 50mg  daily on top of home hyzaar, will arrange outpatient coronary CT and cardiology follow up.    Uncontrolled hypertension: on Hyzaar 100-12.5mg  daily. Add Toprol for better BP control.   HLD: on lipitor 10 mg daily, LDL <95 given metabolic syndrome with HTN, HLD and DM II  DM II: on metformin   Risk Assessment/Risk Scores:     HEAR Score (for undifferentiated chest pain):  HEAR Score: 5          For questions or updates, please contact [place] Please consult [email or web address] for contact info under    Signed, Almyra Deforest,  PA  06/30/2021 3:38 PM

## 2021-06-30 NOTE — Assessment & Plan Note (Signed)

## 2021-06-30 NOTE — ED Notes (Addendum)
Pt denies chest pain currently sitting in stretcher. Cardiac monitoring in place, VSS but remains hypertensive. No distress noted. Denies needs. Wife at bedside, call light in reach.

## 2021-06-30 NOTE — ED Triage Notes (Signed)
Pt reports intermittent chest pain. No other symptoms

## 2021-06-30 NOTE — Discharge Instructions (Addendum)
You have been seen and discharged from the emergency department.  You were evaluated by cardiology, Dr. Martinique.  Your heart work-up here was normal.  They are planning for outpatient follow-up and cardiac testing.  They have also prescribed you a beta-blocker that will be at your pharmacy.  Follow-up with your primary provider for reevaluation and further care. Take home medications as prescribed. If you have any worsening symptoms or further concerns for your health please return to an emergency department for further evaluation.

## 2021-06-30 NOTE — Assessment & Plan Note (Signed)

## 2021-06-30 NOTE — ED Notes (Signed)
Delay in EKG due to pt refusal and demanding we call the cath lab

## 2021-06-30 NOTE — ED Provider Notes (Addendum)
[facility] EMERGENCY DEPARTMENT Provider Note   CSN: [id number] Arrival date & time: 06/30/21  1146     History Chief Complaint  Patient presents with   Chest Pain    Lucas Collins is a 70 y.o. male.  HPI  70 year old male with past medical history of DM, HTN, HLD, previous angina presents to the emergency department concern for chest pain and EKG changes.  Patient was at his scheduled appointment with his primary doctor today, and they reportedly did an EKG that had concern for ST elevations in the precordial leads and he was originally referred to [place] to have blood work done.  Patient lives locally in [place] so came here, reportedly cardiology was notified of the patient's arrival.  On arrival he is having active but mild left-sided chest pain that radiates to the left arm.  He states that he has had back pain chronically for months however over the past couple weeks the back pain seems to be more associated with a left-sided chest pain and left arm pain.  This pain is intermittent, self resolved.  No associated nausea/vomiting/shortness of breath.  No leg swelling.  History reviewed. No pertinent past medical history.  Patient Active Problem List   Diagnosis Date Noted   Angina at rest [facility]) 06/30/2021   Annual physical exam 09/10/2020   Type 2 diabetes mellitus without complication, without long-term current use of insulin ([place]) 09/10/2020   Elevated PSA 09/10/2020   Abrasion of left hand 09/10/2020   Primary hypertension 09/10/2020   Other hyperlipidemia 09/10/2020    History reviewed. No pertinent surgical history.     No family history on file.  Social History   Tobacco Use   Smoking status: Never   Smokeless tobacco: Never  Substance Use Topics   Alcohol use: Never   Drug use: Never    Home Medications Prior to Admission medications   Medication Sig Start Date End Date Taking? Authorizing Provider  DULoxetine (CYMBALTA) 30 MG  capsule Take 1 capsule (30 mg total) by mouth daily. 05/13/21   Cletis Athens, MD  finasteride (PROSCAR) 5 MG tablet Take 1 tablet (5 mg total) by mouth daily. 05/13/21   Cletis Athens, MD  glimepiride (AMARYL) 4 MG tablet TAKE 2 TABLETS(8 MG) BY MOUTH DAILY 02/26/21   Beckie Salts, FNP  losartan-hydrochlorothiazide (HYZAAR) 100-12.5 MG tablet Take 1 tablet by mouth daily. 09/10/20   Beckie Salts, FNP  metFORMIN (GLUCOPHAGE) 500 MG tablet Take 1 tablet (500 mg total) by mouth 2 (two) times daily. 05/13/21   Cletis Athens, MD  methylPREDNISolone (MEDROL DOSEPAK) 4 MG TBPK tablet As directed 05/13/21   Cletis Athens, MD  rosuvastatin (CRESTOR) 10 MG tablet Take 1 tablet (10 mg total) by mouth daily. 05/13/21   Cletis Athens, MD  tretinoin (RETIN-A) 0.025 % cream Apply topically at bedtime. Apply to acne as needed 05/19/21   Cletis Athens, MD    Allergies    Patient has no known allergies.  Review of Systems   Review of Systems  Constitutional:  Negative for chills and fever.  HENT:  Negative for congestion.   Eyes:  Negative for visual disturbance.  Respiratory:  Negative for shortness of breath.   Cardiovascular:  Positive for chest pain. Negative for palpitations and leg swelling.  Gastrointestinal:  Negative for abdominal pain, diarrhea and vomiting.  Genitourinary:  Negative for dysuria.  Skin:  Negative for rash.  Neurological:  Negative for headaches.   Physical Exam Updated Vital Signs BP Marland Kitchen)  201/92   Pulse 70   Temp 98.3 F (36.8 C) (Oral)   Resp 19   SpO2 98%   Physical Exam Vitals and nursing note reviewed.  Constitutional:      General: He is not in acute distress.    Appearance: Normal appearance. He is not ill-appearing.  HENT:     Head: Normocephalic.     Mouth/Throat:     Mouth: Mucous membranes are moist.  Cardiovascular:     Rate and Rhythm: Normal rate.  Pulmonary:     Effort: Pulmonary effort is normal. No tachypnea or respiratory distress.  Chest:      Chest wall: No tenderness or crepitus.  Abdominal:     Palpations: Abdomen is soft.     Tenderness: There is no abdominal tenderness.  Musculoskeletal:     Right lower leg: No edema.     Left lower leg: Edema present.  Skin:    General: Skin is warm.  Neurological:     Mental Status: He is alert and oriented to person, place, and time. Mental status is at baseline.  Psychiatric:        Mood and Affect: Mood normal.    ED Results / Procedures / Treatments   Labs (all labs ordered are listed, but only abnormal results are displayed) Labs Reviewed  CBC WITH DIFFERENTIAL/PLATELET  BASIC METABOLIC PANEL  TROPONIN I (HIGH SENSITIVITY)    EKG EKG Interpretation  Date/Time:  Wednesday June 30 2021 11:58:23 EDT Ventricular Rate:  83 PR Interval:  202 QRS Duration: 74 QT Interval:  358 QTC Calculation: 420 R Axis:   46 Text Interpretation: Normal sinus rhythm Anterior infarct , age undetermined Abnormal ECG No old tracing to compare Confirmed by Davonna Belling [phone]) on 06/30/2021 12:09:55 PM  Radiology No results found.  Procedures Procedures   Medications Ordered in ED Medications  nitroGLYCERIN (NITROSTAT) SL tablet 0.4 mg (has no administration in time range)    ED Course  I have reviewed the triage vital signs and the nursing notes.  Pertinent labs & imaging results that were available during my care of the patient were reviewed by me and considered in my medical decision making (see chart for details).  Clinical Course as of 06/30/21 1537  Wed Jun 30, 2021  1527 L chest pain with L arm radation, ecg with very possible STE but cardiology disagrees, pain relieved with nitro; fu cards after repeat trop [MK]    Clinical Course User Index [MK] Kommor, [place], MD   MDM Rules/Calculators/A&P                           70 year old male presents the emergency department with intermittent left-sided chest pain and concern for EKG changes at his primary care  appointment this morning.  Patient sent here for rule out ACS.  EKG is sinus rhythm, slight ST elevation in the precordial leads but not STEMI criteria.  Cardiology was aware of the patient's arrival, Dr. Martinique evaluated the EKG and agrees no STEMI criteria. Patient was given SL nitro, BP improved, has been CP free.  Patient's blood work is reassuring, troponin is negative.  Dr. Martinique from cardiology came and personally evaluated the patient.  Low suspicion for ACS at this time, plan for outpatient follow-up, [facility] and they are going to prescribe him the beta-blocker in the meantime.  No need for repeat troponin.  Patient feels well, no active chest pain.  Patient at this  time appears safe and stable for discharge and will be treated as an outpatient.  Discharge plan and strict return to ED precautions discussed, patient verbalizes understanding and agreement.  Final Clinical Impression(s) / ED Diagnoses Final diagnoses:  None    Rx / DC Orders ED Discharge Orders     None        Lorelle Gibbs, DO 06/30/21 1443    Jalecia Leon, Alvin Critchley, DO 06/30/21 1539

## 2021-06-30 NOTE — ED Provider Notes (Signed)
Emergency Medicine Provider Triage Evaluation Note  Lucas Collins , a 70 y.o. male  was evaluated in triage.  Pt complains of chest pain.  Chest pain has been intermittent over the last 6 weeks.  Pain is located to left chest and does not radiate.  No associated nausea, vomiting, diaphoresis, shortness of breath.  Patient went to his primary care provider today EKG showed changes from previous.  Patient came here for further evaluation.  Review of Systems  Positive: Chest pain Negative: Nausea, vomiting, diaphoresis, shortness of breath, lightheadedness, syncope, swelling or tenderness to bilateral lower extremities  Physical Exam  BP (!) 201/92   Pulse 79   Temp 98.3 F (36.8 C) (Oral)   Resp 20   SpO2 96%  Gen:   Awake, no distress   Resp:  Normal effort, lungs clear to auscultation bilaterally MSK:   Moves extremities without difficulty; no swelling or tenderness to bilateral lower extremities. Other:  +2 radial pulse bilaterally.  Medical Decision Making  Medically screening exam initiated at 12:03 PM.  Appropriate orders placed.  Jermon Chalfant was informed that the remainder of the evaluation will be completed by another provider, this initial triage assessment does not replace that evaluation, and the importance of remaining in the ED until their evaluation is complete.  The patient appears stable so that the remainder of the work up may be completed by another provider.      Loni Beckwith, PA-C 06/30/21 1213    Davonna Belling, MD 06/30/21 1630

## 2021-06-30 NOTE — Assessment & Plan Note (Signed)
Referred to [facility] 

## 2021-06-30 NOTE — Assessment & Plan Note (Signed)
Hypercholesterolemia  I advised the patient to follow Mediterranean diet This diet is rich in fruits vegetables and whole grain, and This diet is also rich in fish and lean meat Patient should also eat a handful of almonds or walnuts daily Recent heart study indicated that average follow-up on this kind of diet reduces the cardiovascular mortality by 50 to 70%== 

## 2021-06-30 NOTE — Progress Notes (Signed)
Established Patient Office Visit  Subjective:  Patient ID: Lucas Collins, male    DOB: 1951/07/22  Age: 70 y.o. MRN: [id number]  CC:  Chief Complaint  Patient presents with   Back Pain    Back Pain Associated symptoms include chest pain. Pertinent negatives include no fever, headaches or weakness.  Chest Pain  This is a new problem. The current episode started 1 to 4 weeks ago. The onset quality is sudden. The problem has been waxing and waning. The pain is present in the lateral region and substernal region. The pain is at a severity of 4/10. The pain is mild. The quality of the pain is described as pressure. Radiates to: subscapular. Associated symptoms include back pain. Pertinent negatives include no diaphoresis, dizziness, exertional chest pressure, fever, headaches, lower extremity edema, malaise/fatigue, palpitations, PND, shortness of breath, syncope, vomiting or weakness.   Lucas Collins presents for for general checkup.Patient is known to have high cholesterol elevated PSA type 2 diabetes primary hypertension.c/o chest pain lt arm pain , no nausea ,no diaphoresis, no syncope History reviewed. No pertinent past medical history.  History reviewed. No pertinent surgical history.  History reviewed. No pertinent family history.  Social History   Socioeconomic History   Marital status: Married    Spouse name: Not on file   Number of children: Not on file   Years of education: Not on file   Highest education level: Not on file  Occupational History   Not on file  Tobacco Use   Smoking status: Never   Smokeless tobacco: Never  Substance and Sexual Activity   Alcohol use: Never   Drug use: Never   Sexual activity: Yes  Other Topics Concern   Not on file  Social History Narrative   Not on file   Social Determinants of Health   Financial Resource Strain: Low Risk    Difficulty of Paying Living Expenses: Not hard at all  Food Insecurity: No Food Insecurity   Worried  About Running Out of Food in the Last Year: Never true   [place] in the Last Year: Never true  Transportation Needs: No Transportation Needs   Lack of Transportation (Medical): No   Lack of Transportation (Non-Medical): No  Physical Activity: Sufficiently Active   Days of Exercise per Week: 5 days   Minutes of Exercise per Session: 30 min  Stress: No Stress Concern Present   Feeling of Stress : Not at all  Social Connections: Socially Integrated   Frequency of Communication with Friends and Family: More than three times a week   Frequency of Social Gatherings with Friends and Family: More than three times a week   Attends Religious Services: More than 4 times per year   Active Member of [employer] or Organizations: Yes   Attends Music therapist: More than 4 times per year   Marital Status: Married  Human resources officer Violence: Not At Risk   Fear of Current or Ex-Partner: No   Emotionally Abused: No   Physically Abused: No   Sexually Abused: No     Current Outpatient Medications:    DULoxetine (CYMBALTA) 30 MG capsule, Take 1 capsule (30 mg total) by mouth daily., Disp: 90 capsule, Rfl: 3   finasteride (PROSCAR) 5 MG tablet, Take 1 tablet (5 mg total) by mouth daily., Disp: 90 tablet, Rfl: 3   glimepiride (AMARYL) 4 MG tablet, TAKE 2 TABLETS(8 MG) BY MOUTH DAILY, Disp: 90 tablet, Rfl: 3   losartan-hydrochlorothiazide (HYZAAR)  100-12.5 MG tablet, Take 1 tablet by mouth daily., Disp: 90 tablet, Rfl: 3   metFORMIN (GLUCOPHAGE) 500 MG tablet, Take 1 tablet (500 mg total) by mouth 2 (two) times daily., Disp: 180 tablet, Rfl: 3   methylPREDNISolone (MEDROL DOSEPAK) 4 MG TBPK tablet, As directed, Disp: 21 each, Rfl: 0   rosuvastatin (CRESTOR) 10 MG tablet, Take 1 tablet (10 mg total) by mouth daily., Disp: 90 tablet, Rfl: 3   tretinoin (RETIN-A) 0.025 % cream, Apply topically at bedtime. Apply to acne as needed, Disp: 45 g, Rfl: 3   No Known Allergies  ROS Review of  Systems  Constitutional: Negative.  Negative for diaphoresis, fever and malaise/fatigue.  HENT: Negative.    Eyes: Negative.   Respiratory: Negative.  Negative for shortness of breath.   Cardiovascular:  Positive for chest pain. Negative for palpitations, syncope and PND.  Gastrointestinal: Negative.  Negative for vomiting.  Endocrine: Negative.   Genitourinary: Negative.   Musculoskeletal:  Positive for back pain.  Skin: Negative.   Allergic/Immunologic: Negative.   Neurological: Negative.  Negative for dizziness, weakness and headaches.  Hematological: Negative.   Psychiatric/Behavioral: Negative.    All other systems reviewed and are negative.    Objective:    Physical Exam Vitals reviewed.  Constitutional:      Appearance: Normal appearance.  HENT:     Mouth/Throat:     Mouth: Mucous membranes are moist.  Eyes:     Pupils: Pupils are equal, round, and reactive to light.  Neck:     Vascular: No carotid bruit.  Cardiovascular:     Rate and Rhythm: Normal rate and regular rhythm.     Pulses: Normal pulses.     Heart sounds: Normal heart sounds.  Pulmonary:     Effort: Pulmonary effort is normal.     Breath sounds: Normal breath sounds.  Abdominal:     General: Bowel sounds are normal.     Palpations: Abdomen is soft. There is no hepatomegaly, splenomegaly or mass.     Tenderness: There is no abdominal tenderness.     Hernia: No hernia is present.  Musculoskeletal:     Cervical back: Neck supple.     Right lower leg: No edema.     Left lower leg: No edema.  Skin:    Findings: No rash.  Neurological:     Mental Status: He is alert and oriented to person, place, and time.     Motor: No weakness.  Psychiatric:        Mood and Affect: Mood normal.        Behavior: Behavior normal.    BP (!) 166/83   Pulse 75   Ht 6' (1.829 m)   Wt 172 lb 6.4 oz (78.2 kg)   BMI 23.38 kg/m  Wt Readings from Last 3 Encounters:  06/30/21 172 lb 6.4 oz (78.2 kg)  09/10/20 169  lb 14.4 oz (77.1 kg)     Health Maintenance Due  Topic Date Due   FOOT EXAM  Never done   OPHTHALMOLOGY EXAM  Never done   Hepatitis C Screening  Never done   Zoster Vaccines- Shingrix (1 of 2) Never done   HEMOGLOBIN A1C  03/11/2021   INFLUENZA VACCINE  04/26/2021    There are no preventive care reminders to display for this patient.  No results found for: TSH Lab Results  Component Value Date   WBC 7.5 09/10/2020   HGB 11.9 (L) 09/10/2020   HCT 36.5 (L) 09/10/2020  MCV 89.5 09/10/2020   PLT 291 09/10/2020   Lab Results  Component Value Date   NA 143 09/10/2020   K 4.6 09/10/2020   CO2 27 09/10/2020   GLUCOSE 183 (H) 09/10/2020   BUN 29 (H) 09/10/2020   CREATININE 0.82 09/10/2020   BILITOT 0.2 09/10/2020   AST 18 09/10/2020   ALT 17 09/10/2020   PROT 6.6 09/10/2020   CALCIUM 9.8 09/10/2020   Lab Results  Component Value Date   CHOL 168 09/10/2020   Lab Results  Component Value Date   HDL 57 09/10/2020   Lab Results  Component Value Date   LDLCALC 96 09/10/2020   Lab Results  Component Value Date   TRIG 63 09/10/2020   Lab Results  Component Value Date   CHOLHDL 2.9 09/10/2020   Lab Results  Component Value Date   HGBA1C 7.6 (H) 09/10/2020      Assessment & Plan:   Problem List Items Addressed This Visit       Cardiovascular and Mediastinum   Primary hypertension    The following hypertensive lifestyle modification were recommended and discussed:  1. Limiting alcohol intake to less than 1 oz/day of ethanol:(24 oz of beer or 8 oz of wine or 2 oz of 100-proof whiskey). 2. Take baby ASA 81 mg daily. 3. Importance of regular aerobic exercise and losing weight. 4. Reduce dietary saturated fat and cholesterol intake for overall cardiovascular health. 5. Maintaining adequate dietary potassium, calcium, and magnesium intake. 6. Regular monitoring of the blood pressure. 7. Reduce sodium intake to less than 100 mmol/day (less than 2.3 gm of sodium  or less than 6 gm of sodium choride)       Angina at rest [facility])    Patient came with chest pain as described in the HPI, his EKG is abnormal, I wanted to send him to [facility] stat but he wanted to go to [facility], he was given the name of Dr. Fletcher Anon and Dr. Martinique to contact from the emergency room for further work-up and treatment.  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  He was given sublingual baby aspirin 81 mg        Endocrine   Type 2 diabetes mellitus without complication, without long-term current use of insulin ([place])    - The patient's blood sugar is labile on med. - The patient will continue the current treatment regimen.  - I encouraged the patient to regularly check blood sugar.  - I encouraged the patient to monitor diet. I encouraged the patient to eat low-carb and low-sugar to help prevent blood sugar spikes.  - I encouraged the patient to continue following their prescribed treatment plan for diabetes - I informed the patient to get help if blood sugar drops below 54mg /dL, or if suddenly have trouble thinking clearly or breathing.  Patient was advised to buy a book on diabetes from a local bookstore or from [place]  Patient should read 2 chapters every day to keep the motivation going, this is in addition to some of the materials we provided them from the office.  There are other resources on the Internet  like YouTube and wilkipedia to get an education on the diabetes        Other   Elevated PSA    Referred to [facility]      Other hyperlipidemia    Hypercholesterolemia  I advised the patient to follow Mediterranean diet This diet is rich in fruits vegetables and whole grain,  and This diet is also rich in fish and lean meat Patient should also eat a handful of almonds or walnuts daily Recent heart study indicated that average follow-up on this kind of diet reduces the cardiovascular mortality by 50 to 70%==      Other Visit Diagnoses     Chest pain, unspecified type    -  Primary   Relevant Orders   EKG 12-Lead       No orders of the defined types were placed in this encounter. Report of the electrocardiogram. Electrocardiogram revealed ST changes in the anterior leads suggestive of acute myocardial injury.  Follow-up: No follow-ups on file.    Cletis Athens, MD

## 2021-06-30 NOTE — Assessment & Plan Note (Signed)
Patient came with chest pain as described in the HPI, his EKG is abnormal, I wanted to send him to [facility] stat but he wanted to go to [facility], he was given the name of Dr. Fletcher Anon and Dr. Martinique to contact from the emergency room for further work-up and treatment.  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  He was given sublingual baby aspirin 81 mg

## 2021-06-30 NOTE — Progress Notes (Signed)
Spoke with Dr. Martinique, recommended addition of low-dose 10 meq daily of potassium supplement.  I will obtain repeat basic metabolic panel in 2 to 3 weeks.  Instruction has been given to the wife

## 2021-07-01 DIAGNOSIS — Z4789 Encounter for other orthopedic aftercare: Secondary | ICD-10-CM | POA: Diagnosis not present

## 2021-07-01 DIAGNOSIS — G5601 Carpal tunnel syndrome, right upper limb: Secondary | ICD-10-CM | POA: Diagnosis not present

## 2021-07-15 DIAGNOSIS — R200 Anesthesia of skin: Secondary | ICD-10-CM | POA: Diagnosis not present

## 2021-07-22 DIAGNOSIS — M79641 Pain in right hand: Secondary | ICD-10-CM | POA: Diagnosis not present

## 2021-07-26 ENCOUNTER — Other Ambulatory Visit: Payer: Self-pay | Admitting: *Deleted

## 2021-07-26 MED ORDER — LOSARTAN POTASSIUM 100 MG PO TABS: 100.0000 mg | ORAL_TABLET | Freq: Every day | ORAL | 3 refills | Status: DC

## 2021-07-26 MED ORDER — FUROSEMIDE 20 MG PO TABS: 20.0000 mg | ORAL_TABLET | ORAL | 3 refills | Status: DC

## 2021-07-29 DIAGNOSIS — M79641 Pain in right hand: Secondary | ICD-10-CM | POA: Diagnosis not present

## 2021-09-09 ENCOUNTER — Telehealth: Payer: Self-pay

## 2021-09-09 ENCOUNTER — Ambulatory Visit: Payer: PPO | Admitting: Internal Medicine

## 2021-09-09 NOTE — Telephone Encounter (Signed)
Patient has called multiple times regarding his referral from [facility]. On the most recent call, patient was upset that it was taking so long for his referral to be reviewed, he wanted to know what the standard time frame for reviewal was. I mentioned that since he is asking [place] to bypass office policy, the review process will take a little longer than it normally does. 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 He then asked if he needed to be referred to his [facility], since we are "too busy" to get him scheduled for an appointment at our office.   I again reiterated that he is asking [place] to bypass office policy for him, and that unfortunately, there was nothing I could do until I got approval for that, and that if he preferred to see [facility] regarding this, he could definitely reach out to them about an appointment.

## 2021-09-10 DIAGNOSIS — M542 Cervicalgia: Secondary | ICD-10-CM | POA: Diagnosis not present

## 2021-09-10 DIAGNOSIS — G5601 Carpal tunnel syndrome, right upper limb: Secondary | ICD-10-CM | POA: Diagnosis not present

## 2021-09-28 ENCOUNTER — Other Ambulatory Visit: Payer: Self-pay | Admitting: *Deleted

## 2021-09-28 MED ORDER — METFORMIN HCL 500 MG PO TABS: 500.0000 mg | ORAL_TABLET | Freq: Two times a day (BID) | ORAL | 3 refills | Status: DC

## 2021-09-28 MED ORDER — FINASTERIDE 5 MG PO TABS: 5.0000 mg | ORAL_TABLET | Freq: Every day | ORAL | 3 refills | Status: DC

## 2021-09-30 ENCOUNTER — Other Ambulatory Visit: Payer: Self-pay | Admitting: *Deleted

## 2021-09-30 MED ORDER — AZITHROMYCIN 250 MG PO TABS: ORAL_TABLET | ORAL | 0 refills | Status: AC

## 2021-10-01 DIAGNOSIS — M5412 Radiculopathy, cervical region: Secondary | ICD-10-CM | POA: Diagnosis not present

## 2021-10-08 DIAGNOSIS — M79641 Pain in right hand: Secondary | ICD-10-CM | POA: Diagnosis not present

## 2021-10-08 DIAGNOSIS — G5622 Lesion of ulnar nerve, left upper limb: Secondary | ICD-10-CM | POA: Diagnosis not present

## 2021-10-08 DIAGNOSIS — G5603 Carpal tunnel syndrome, bilateral upper limbs: Secondary | ICD-10-CM | POA: Diagnosis not present

## 2021-10-08 DIAGNOSIS — M79642 Pain in left hand: Secondary | ICD-10-CM | POA: Diagnosis not present

## 2021-10-14 DIAGNOSIS — G5622 Lesion of ulnar nerve, left upper limb: Secondary | ICD-10-CM | POA: Diagnosis not present

## 2021-10-15 ENCOUNTER — Encounter (HOSPITAL_COMMUNITY): Payer: Self-pay

## 2021-10-22 DIAGNOSIS — G6289 Other specified polyneuropathies: Secondary | ICD-10-CM | POA: Diagnosis not present

## 2021-10-22 DIAGNOSIS — M5412 Radiculopathy, cervical region: Secondary | ICD-10-CM | POA: Diagnosis not present

## 2021-10-28 DIAGNOSIS — M5002 Cervical disc disorder with myelopathy, mid-cervical region, unspecified level: Secondary | ICD-10-CM | POA: Diagnosis not present

## 2021-11-01 ENCOUNTER — Other Ambulatory Visit: Payer: Self-pay | Admitting: *Deleted

## 2021-11-01 MED ORDER — POTASSIUM CHLORIDE CRYS ER 10 MEQ PO TBCR: 10.0000 meq | EXTENDED_RELEASE_TABLET | Freq: Every day | ORAL | 1 refills | Status: DC

## 2021-11-15 DIAGNOSIS — M4802 Spinal stenosis, cervical region: Secondary | ICD-10-CM | POA: Diagnosis not present

## 2021-11-15 DIAGNOSIS — R29898 Other symptoms and signs involving the musculoskeletal system: Secondary | ICD-10-CM | POA: Diagnosis not present

## 2021-11-15 DIAGNOSIS — M47812 Spondylosis without myelopathy or radiculopathy, cervical region: Secondary | ICD-10-CM | POA: Diagnosis not present

## 2021-11-15 DIAGNOSIS — M542 Cervicalgia: Secondary | ICD-10-CM | POA: Diagnosis not present

## 2021-11-15 DIAGNOSIS — E1142 Type 2 diabetes mellitus with diabetic polyneuropathy: Secondary | ICD-10-CM | POA: Diagnosis not present

## 2021-11-18 ENCOUNTER — Encounter: Payer: PPO | Admitting: Diagnostic Neuroimaging

## 2021-11-19 DIAGNOSIS — R29898 Other symptoms and signs involving the musculoskeletal system: Secondary | ICD-10-CM | POA: Diagnosis not present

## 2021-11-19 DIAGNOSIS — R531 Weakness: Secondary | ICD-10-CM | POA: Diagnosis not present

## 2021-11-19 DIAGNOSIS — M79642 Pain in left hand: Secondary | ICD-10-CM | POA: Diagnosis not present

## 2021-11-19 DIAGNOSIS — M47812 Spondylosis without myelopathy or radiculopathy, cervical region: Secondary | ICD-10-CM | POA: Diagnosis not present

## 2021-11-25 DIAGNOSIS — M79642 Pain in left hand: Secondary | ICD-10-CM | POA: Diagnosis not present

## 2021-11-25 DIAGNOSIS — R531 Weakness: Secondary | ICD-10-CM | POA: Diagnosis not present

## 2021-12-09 ENCOUNTER — Other Ambulatory Visit: Payer: Self-pay | Admitting: Internal Medicine

## 2021-12-15 DIAGNOSIS — Z8616 Personal history of COVID-19: Secondary | ICD-10-CM | POA: Diagnosis not present

## 2021-12-15 DIAGNOSIS — G959 Disease of spinal cord, unspecified: Secondary | ICD-10-CM | POA: Diagnosis not present

## 2021-12-15 DIAGNOSIS — M5002 Cervical disc disorder with myelopathy, mid-cervical region, unspecified level: Secondary | ICD-10-CM | POA: Diagnosis not present

## 2021-12-15 DIAGNOSIS — M4322 Fusion of spine, cervical region: Secondary | ICD-10-CM | POA: Diagnosis not present

## 2021-12-15 DIAGNOSIS — M4712 Other spondylosis with myelopathy, cervical region: Secondary | ICD-10-CM | POA: Diagnosis not present

## 2021-12-15 DIAGNOSIS — M50021 Cervical disc disorder at C4-C5 level with myelopathy: Secondary | ICD-10-CM | POA: Diagnosis not present

## 2021-12-15 DIAGNOSIS — Z79899 Other long term (current) drug therapy: Secondary | ICD-10-CM | POA: Diagnosis not present

## 2021-12-15 DIAGNOSIS — Z638 Other specified problems related to primary support group: Secondary | ICD-10-CM | POA: Diagnosis not present

## 2021-12-15 DIAGNOSIS — I10 Essential (primary) hypertension: Secondary | ICD-10-CM | POA: Diagnosis not present

## 2021-12-15 DIAGNOSIS — R0603 Acute respiratory distress: Secondary | ICD-10-CM | POA: Diagnosis not present

## 2021-12-15 DIAGNOSIS — M50023 Cervical disc disorder at C6-C7 level with myelopathy: Secondary | ICD-10-CM | POA: Diagnosis not present

## 2021-12-15 DIAGNOSIS — E119 Type 2 diabetes mellitus without complications: Secondary | ICD-10-CM | POA: Diagnosis not present

## 2021-12-15 DIAGNOSIS — M5030 Other cervical disc degeneration, unspecified cervical region: Secondary | ICD-10-CM | POA: Diagnosis not present

## 2021-12-15 DIAGNOSIS — Z7984 Long term (current) use of oral hypoglycemic drugs: Secondary | ICD-10-CM | POA: Diagnosis not present

## 2021-12-15 DIAGNOSIS — R1310 Dysphagia, unspecified: Secondary | ICD-10-CM | POA: Diagnosis not present

## 2021-12-15 DIAGNOSIS — J384 Edema of larynx: Secondary | ICD-10-CM | POA: Diagnosis not present

## 2021-12-15 DIAGNOSIS — M50022 Cervical disc disorder at C5-C6 level with myelopathy: Secondary | ICD-10-CM | POA: Diagnosis not present

## 2021-12-15 DIAGNOSIS — M4802 Spinal stenosis, cervical region: Secondary | ICD-10-CM | POA: Diagnosis not present

## 2021-12-15 DIAGNOSIS — M5001 Cervical disc disorder with myelopathy,  high cervical region: Secondary | ICD-10-CM | POA: Diagnosis not present

## 2021-12-15 DIAGNOSIS — R490 Dysphonia: Secondary | ICD-10-CM | POA: Diagnosis not present

## 2021-12-15 DIAGNOSIS — M40202 Unspecified kyphosis, cervical region: Secondary | ICD-10-CM | POA: Diagnosis not present

## 2021-12-24 DIAGNOSIS — M79642 Pain in left hand: Secondary | ICD-10-CM | POA: Diagnosis not present

## 2021-12-24 DIAGNOSIS — R531 Weakness: Secondary | ICD-10-CM | POA: Diagnosis not present

## 2021-12-28 ENCOUNTER — Other Ambulatory Visit: Payer: Self-pay | Admitting: Physician Assistant

## 2021-12-31 DIAGNOSIS — M6281 Muscle weakness (generalized): Secondary | ICD-10-CM | POA: Diagnosis not present

## 2022-01-06 DIAGNOSIS — M6281 Muscle weakness (generalized): Secondary | ICD-10-CM | POA: Diagnosis not present

## 2022-01-06 DIAGNOSIS — E119 Type 2 diabetes mellitus without complications: Secondary | ICD-10-CM | POA: Diagnosis not present

## 2022-01-07 DIAGNOSIS — M6281 Muscle weakness (generalized): Secondary | ICD-10-CM | POA: Diagnosis not present

## 2022-01-13 DIAGNOSIS — M21332 Wrist drop, left wrist: Secondary | ICD-10-CM | POA: Diagnosis not present

## 2022-01-13 DIAGNOSIS — R200 Anesthesia of skin: Secondary | ICD-10-CM | POA: Diagnosis not present

## 2022-01-14 DIAGNOSIS — M6281 Muscle weakness (generalized): Secondary | ICD-10-CM | POA: Diagnosis not present

## 2022-02-10 ENCOUNTER — Other Ambulatory Visit: Payer: Self-pay | Admitting: *Deleted

## 2022-02-10 MED ORDER — AZITHROMYCIN 250 MG PO TABS: ORAL_TABLET | ORAL | 0 refills | Status: AC

## 2022-02-24 ENCOUNTER — Other Ambulatory Visit: Payer: Self-pay | Admitting: Internal Medicine

## 2022-02-24 ENCOUNTER — Other Ambulatory Visit: Payer: Self-pay | Admitting: Physician Assistant

## 2022-03-02 DIAGNOSIS — G5632 Lesion of radial nerve, left upper limb: Secondary | ICD-10-CM | POA: Diagnosis not present

## 2022-03-02 DIAGNOSIS — G5602 Carpal tunnel syndrome, left upper limb: Secondary | ICD-10-CM | POA: Diagnosis not present

## 2022-03-04 ENCOUNTER — Ambulatory Visit (INDEPENDENT_AMBULATORY_CARE_PROVIDER_SITE_OTHER): Payer: PPO | Admitting: Nurse Practitioner

## 2022-03-04 ENCOUNTER — Encounter: Payer: Self-pay | Admitting: Nurse Practitioner

## 2022-03-04 VITALS — BP 158/81 | HR 54 | Ht 72.0 in | Wt 164.2 lb

## 2022-03-04 DIAGNOSIS — G5602 Carpal tunnel syndrome, left upper limb: Secondary | ICD-10-CM

## 2022-03-04 NOTE — Assessment & Plan Note (Signed)
Patient has carpal tunnel syndrome on left hand. We will send a referral to the hand surgeon at [facility].

## 2022-03-04 NOTE — Progress Notes (Signed)
Established Patient Office Visit  Subjective:  Patient ID: Lucas Collins, male    DOB: [birth year]  Age: 71 y.o. MRN: [id number]  CC:  Chief Complaint  Patient presents with   left hand pain    Patient here today for referral to hand surgeon for carpal tunnel for his left hand      HPI  Lucas Collins presents for a referral to the orthopedic surgeon at [facility] for carpal tunnel surgery in his left hand.  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 Patient had an initial consult with with Dr. Hulen Luster at [facility] in [place].  Patient is not able to use his left hand.  Patient has no other complaints at present.  HPI   Past Medical History:  Diagnosis Date   DM II (diabetes mellitus, type II), controlled ([place])    Elevated PSA    Hyperlipidemia LDL goal <70    Hypertension     Past Surgical History:  Procedure Laterality Date   No Significant past surgery      Family History  Problem Relation Age of Onset   Diabetes Mother    Heart attack Father     Social History   Socioeconomic History   Marital status: Married    Spouse name: Not on file   Number of children: Not on file   Years of education: Not on file   Highest education level: Not on file  Occupational History   Not on file  Tobacco Use   Smoking status: Never   Smokeless tobacco: Never  Substance and Sexual Activity   Alcohol use: Never   Drug use: Never   Sexual activity: Yes  Other Topics Concern   Not on file  Social History Narrative   Not on file   Social Determinants of Health   Financial Resource Strain: Low Risk  (05/13/2021)   Overall Financial Resource Strain (CARDIA)    Difficulty of Paying Living Expenses: Not hard at all  Food Insecurity: No Food Insecurity (05/13/2021)   Hunger Vital Sign    Worried About Running Out of Food in the Last Year: Never true    Ran Out of Food in the Last Year: Never true  Transportation  Needs: No Transportation Needs (05/13/2021)   PRAPARE - Hydrologist (Medical): No    Lack of Transportation (Non-Medical): No  Physical Activity: Sufficiently Active (05/13/2021)   Exercise Vital Sign    Days of Exercise per Week: 5 days    Minutes of Exercise per Session: 30 min  Stress: No Stress Concern Present (05/13/2021)   [place]    Feeling of Stress : Not at all  Social Connections: [place] (05/13/2021)   Social Connection and Isolation Panel [NHANES]    Frequency of Communication with Friends and Family: More than three times a week    Frequency of Social Gatherings with Friends and Family: More than three times a week    Attends Religious Services: More than 4 times per year    Active Member of [employer] or Organizations: Yes    Attends Archivist Meetings: More than 4 times per year    Marital Status: Married  Human resources officer Violence: Not At Risk (05/13/2021)   Humiliation, Afraid, Rape, and Kick questionnaire    Fear of Current or Ex-Partner: No    Emotionally Abused: No  Physically Abused: No    Sexually Abused: No     Outpatient Medications Prior to Visit  Medication Sig Dispense Refill   DULoxetine (CYMBALTA) 30 MG capsule Take 1 capsule (30 mg total) by mouth daily. 90 capsule 3   finasteride (PROSCAR) 5 MG tablet Take 1 tablet (5 mg total) by mouth daily. 90 tablet 3   furosemide (LASIX) 20 MG tablet Take 1 tablet (20 mg total) by mouth 2 (two) times a week. 30 tablet 3   glimepiride (AMARYL) 4 MG tablet TAKE 2 TABLETS(8 MG) BY MOUTH DAILY 90 tablet 3   losartan (COZAAR) 100 MG tablet Take 1 tablet (100 mg total) by mouth daily. 90 tablet 3   metFORMIN (GLUCOPHAGE) 500 MG tablet Take 1 tablet (500 mg total) by mouth 2 (two) times daily. 180 tablet 3   methylPREDNISolone (MEDROL DOSEPAK) 4 MG TBPK tablet As directed 21 each 0   metoprolol succinate  (TOPROL-XL) 50 MG 24 hr tablet TAKE 1 TABLET(50 MG) BY MOUTH DAILY 90 tablet 2   Multiple Vitamin (MULTI-VITAMIN) tablet Take 1 tablet by mouth daily.     Multiple Vitamins-Minerals (PRESERVISION AREDS PO) Take 2 capsules by mouth 2 (two) times daily.     naproxen sodium (ALEVE) 220 MG tablet Take 440 mg by mouth 2 (two) times daily as needed (pain).     potassium chloride (KLOR-CON M) 10 MEQ tablet TAKE 1 TABLET(10 MEQ) BY MOUTH DAILY 30 tablet 1   rosuvastatin (CRESTOR) 10 MG tablet Take 1 tablet (10 mg total) by mouth daily. 90 tablet 3   tretinoin (RETIN-A) 0.025 % cream Apply topically at bedtime. Apply to acne as needed (Patient taking differently: Apply 1 application  topically at bedtime as needed (acne).) 45 g 3   No facility-administered medications prior to visit.    No Known Allergies  ROS Review of Systems  Constitutional:  Negative for activity change and chills.  HENT:  Negative for congestion, facial swelling and sinus pain.   Eyes:  Negative for pain and discharge.  Respiratory:  Negative for apnea, cough, chest tightness and shortness of breath.   Cardiovascular:  Negative for chest pain and leg swelling.  Gastrointestinal:  Negative for abdominal distention, abdominal pain, blood in stool and diarrhea.  Genitourinary:  Negative for difficulty urinating, dysuria and frequency.  Musculoskeletal:  Positive for joint swelling (left hand). Negative for arthralgias, back pain and gait problem.  Skin:  Negative for color change, pallor and rash.  Neurological:  Negative for dizziness, facial asymmetry, light-headedness and headaches.  Hematological:  Negative for adenopathy.  Psychiatric/Behavioral:  Negative for agitation, behavioral problems and confusion.       Objective:    Physical Exam Constitutional:      Appearance: Normal appearance. He is normal weight.  HENT:     Head: Normocephalic and atraumatic.     Right Ear: Tympanic membrane normal.     Left Ear:  Tympanic membrane normal.     Nose: Nose normal.     Mouth/Throat:     Mouth: Mucous membranes are moist.     Pharynx: Oropharynx is clear.  Eyes:     Conjunctiva/sclera: Conjunctivae normal.     Pupils: Pupils are equal, round, and reactive to light.  Cardiovascular:     Rate and Rhythm: Normal rate and regular rhythm.     Pulses: Normal pulses.     Heart sounds: Normal heart sounds.  Pulmonary:     Effort: Pulmonary effort is normal.  Breath sounds: Normal breath sounds.  Abdominal:     General: Abdomen is flat. Bowel sounds are normal.     Palpations: Abdomen is soft.  Musculoskeletal:     Right hand: Normal.     Left hand: Swelling and tenderness present. Decreased range of motion. Decreased strength. Decreased sensation.     Cervical back: Normal range of motion.  Skin:    General: Skin is warm and dry.     Capillary Refill: Capillary refill takes less than 2 seconds.     Findings: No bruising or erythema.  Neurological:     General: No focal deficit present.     Mental Status: He is alert and oriented to person, place, and time. Mental status is at baseline.  Psychiatric:        Mood and Affect: Mood normal.        Behavior: Behavior normal.        Thought Content: Thought content normal.        Judgment: Judgment normal.     BP (!) 158/81   Pulse (!) 54   Ht 6' (1.829 m)   Wt 164 lb 3.2 oz (74.5 kg)   BMI 22.27 kg/m  Wt Readings from Last 3 Encounters:  03/04/22 164 lb 3.2 oz (74.5 kg)  06/30/21 172 lb 6.4 oz (78.2 kg)  09/10/20 169 lb 14.4 oz (77.1 kg)     Health Maintenance Due  Topic Date Due   FOOT EXAM  Never done   OPHTHALMOLOGY EXAM  Never done   Hepatitis C Screening  Never done   Zoster Vaccines- Shingrix (1 of 2) Never done   Pneumonia Vaccine 34+ Years old (1 - PCV) Never done   HEMOGLOBIN A1C  03/11/2021   COVID-19 Vaccine (5 - Booster for Pfizer series) 03/23/2021    There are no preventive care reminders to display for this  patient.  No results found for: "TSH" Lab Results  Component Value Date   WBC 10.8 (H) 06/30/2021   HGB 12.0 (L) 06/30/2021   HCT 36.9 (L) 06/30/2021   MCV 90.9 06/30/2021   PLT 291 06/30/2021   Lab Results  Component Value Date   NA 139 06/30/2021   K 3.5 06/30/2021   CO2 27 06/30/2021   GLUCOSE 148 (H) 06/30/2021   BUN 23 06/30/2021   CREATININE 0.78 06/30/2021   BILITOT 0.2 09/10/2020   AST 18 09/10/2020   ALT 17 09/10/2020   PROT 6.6 09/10/2020   CALCIUM 9.8 06/30/2021   ANIONGAP 11 06/30/2021   Lab Results  Component Value Date   CHOL 168 09/10/2020   Lab Results  Component Value Date   HDL 57 09/10/2020   Lab Results  Component Value Date   LDLCALC 96 09/10/2020   Lab Results  Component Value Date   TRIG 63 09/10/2020   Lab Results  Component Value Date   CHOLHDL 2.9 09/10/2020   Lab Results  Component Value Date   HGBA1C 7.6 (H) 09/10/2020      Assessment & Plan:   Problem List Items Addressed This Visit       Nervous and Auditory   Carpal tunnel syndrome on left - Primary    Patient has carpal tunnel syndrome on left hand. We will send a referral to the hand surgeon at [facility].        No orders of the defined types were placed in this encounter.    Follow-up: No follow-ups on file.    Uday Jantz  [employer], NP

## 2022-03-17 DIAGNOSIS — M25642 Stiffness of left hand, not elsewhere classified: Secondary | ICD-10-CM | POA: Diagnosis not present

## 2022-03-17 DIAGNOSIS — G5632 Lesion of radial nerve, left upper limb: Secondary | ICD-10-CM | POA: Diagnosis not present

## 2022-03-17 DIAGNOSIS — R29898 Other symptoms and signs involving the musculoskeletal system: Secondary | ICD-10-CM | POA: Diagnosis not present

## 2022-03-21 DIAGNOSIS — G5602 Carpal tunnel syndrome, left upper limb: Secondary | ICD-10-CM | POA: Diagnosis not present

## 2022-04-08 DIAGNOSIS — M4712 Other spondylosis with myelopathy, cervical region: Secondary | ICD-10-CM | POA: Diagnosis not present

## 2022-04-21 DIAGNOSIS — M5412 Radiculopathy, cervical region: Secondary | ICD-10-CM | POA: Diagnosis not present

## 2022-04-21 DIAGNOSIS — I639 Cerebral infarction, unspecified: Secondary | ICD-10-CM | POA: Diagnosis not present

## 2022-05-12 DIAGNOSIS — R531 Weakness: Secondary | ICD-10-CM | POA: Diagnosis not present

## 2022-05-12 DIAGNOSIS — M79642 Pain in left hand: Secondary | ICD-10-CM | POA: Diagnosis not present

## 2022-05-13 DIAGNOSIS — Z9889 Other specified postprocedural states: Secondary | ICD-10-CM | POA: Diagnosis not present

## 2022-05-13 DIAGNOSIS — M4312 Spondylolisthesis, cervical region: Secondary | ICD-10-CM | POA: Diagnosis not present

## 2022-05-13 DIAGNOSIS — Z981 Arthrodesis status: Secondary | ICD-10-CM | POA: Diagnosis not present

## 2022-05-18 DIAGNOSIS — M5412 Radiculopathy, cervical region: Secondary | ICD-10-CM | POA: Diagnosis not present

## 2022-05-18 DIAGNOSIS — E1044 Type 1 diabetes mellitus with diabetic amyotrophy: Secondary | ICD-10-CM | POA: Diagnosis not present

## 2022-05-18 DIAGNOSIS — M5417 Radiculopathy, lumbosacral region: Secondary | ICD-10-CM | POA: Diagnosis not present

## 2022-05-19 DIAGNOSIS — R531 Weakness: Secondary | ICD-10-CM | POA: Diagnosis not present

## 2022-05-19 DIAGNOSIS — G609 Hereditary and idiopathic neuropathy, unspecified: Secondary | ICD-10-CM | POA: Diagnosis not present

## 2022-05-19 DIAGNOSIS — M79642 Pain in left hand: Secondary | ICD-10-CM | POA: Diagnosis not present

## 2022-05-19 DIAGNOSIS — R634 Abnormal weight loss: Secondary | ICD-10-CM | POA: Diagnosis not present

## 2022-05-19 DIAGNOSIS — R202 Paresthesia of skin: Secondary | ICD-10-CM | POA: Diagnosis not present

## 2022-05-24 DIAGNOSIS — M47812 Spondylosis without myelopathy or radiculopathy, cervical region: Secondary | ICD-10-CM | POA: Diagnosis not present

## 2022-05-26 DIAGNOSIS — M79642 Pain in left hand: Secondary | ICD-10-CM | POA: Diagnosis not present

## 2022-05-26 DIAGNOSIS — R531 Weakness: Secondary | ICD-10-CM | POA: Diagnosis not present

## 2022-06-02 DIAGNOSIS — M79642 Pain in left hand: Secondary | ICD-10-CM | POA: Diagnosis not present

## 2022-06-02 DIAGNOSIS — R531 Weakness: Secondary | ICD-10-CM | POA: Diagnosis not present

## 2022-06-16 DIAGNOSIS — M62541 Muscle wasting and atrophy, not elsewhere classified, right hand: Secondary | ICD-10-CM | POA: Diagnosis not present

## 2022-06-16 DIAGNOSIS — M4802 Spinal stenosis, cervical region: Secondary | ICD-10-CM | POA: Diagnosis not present

## 2022-06-16 DIAGNOSIS — R29898 Other symptoms and signs involving the musculoskeletal system: Secondary | ICD-10-CM | POA: Diagnosis not present

## 2022-06-16 DIAGNOSIS — M24541 Contracture, right hand: Secondary | ICD-10-CM | POA: Diagnosis not present

## 2022-06-16 DIAGNOSIS — M40202 Unspecified kyphosis, cervical region: Secondary | ICD-10-CM | POA: Diagnosis not present

## 2022-06-16 DIAGNOSIS — M47812 Spondylosis without myelopathy or radiculopathy, cervical region: Secondary | ICD-10-CM | POA: Diagnosis not present

## 2022-06-16 DIAGNOSIS — Z9889 Other specified postprocedural states: Secondary | ICD-10-CM | POA: Diagnosis not present

## 2022-06-16 DIAGNOSIS — M62542 Muscle wasting and atrophy, not elsewhere classified, left hand: Secondary | ICD-10-CM | POA: Diagnosis not present

## 2022-06-16 DIAGNOSIS — Z981 Arthrodesis status: Secondary | ICD-10-CM | POA: Diagnosis not present

## 2022-06-16 DIAGNOSIS — M24542 Contracture, left hand: Secondary | ICD-10-CM | POA: Diagnosis not present

## 2022-06-17 DIAGNOSIS — E1140 Type 2 diabetes mellitus with diabetic neuropathy, unspecified: Secondary | ICD-10-CM | POA: Diagnosis not present

## 2022-06-17 DIAGNOSIS — M24549 Contracture, unspecified hand: Secondary | ICD-10-CM | POA: Diagnosis not present

## 2022-06-17 DIAGNOSIS — M4802 Spinal stenosis, cervical region: Secondary | ICD-10-CM | POA: Diagnosis not present

## 2022-06-17 DIAGNOSIS — G5603 Carpal tunnel syndrome, bilateral upper limbs: Secondary | ICD-10-CM | POA: Diagnosis not present

## 2022-06-17 DIAGNOSIS — Z981 Arthrodesis status: Secondary | ICD-10-CM | POA: Diagnosis not present

## 2022-06-17 DIAGNOSIS — Z7984 Long term (current) use of oral hypoglycemic drugs: Secondary | ICD-10-CM | POA: Diagnosis not present

## 2022-06-17 DIAGNOSIS — M24542 Contracture, left hand: Secondary | ICD-10-CM | POA: Diagnosis not present

## 2022-06-23 DIAGNOSIS — M79642 Pain in left hand: Secondary | ICD-10-CM | POA: Diagnosis not present

## 2022-06-23 DIAGNOSIS — R531 Weakness: Secondary | ICD-10-CM | POA: Diagnosis not present

## 2022-06-23 DIAGNOSIS — M5412 Radiculopathy, cervical region: Secondary | ICD-10-CM | POA: Diagnosis not present

## 2022-06-23 DIAGNOSIS — G5603 Carpal tunnel syndrome, bilateral upper limbs: Secondary | ICD-10-CM | POA: Diagnosis not present

## 2022-06-23 DIAGNOSIS — M542 Cervicalgia: Secondary | ICD-10-CM | POA: Diagnosis not present

## 2022-06-23 DIAGNOSIS — E1044 Type 1 diabetes mellitus with diabetic amyotrophy: Secondary | ICD-10-CM | POA: Diagnosis not present

## 2022-06-29 DIAGNOSIS — M5136 Other intervertebral disc degeneration, lumbar region: Secondary | ICD-10-CM | POA: Diagnosis not present

## 2022-06-29 DIAGNOSIS — Z981 Arthrodesis status: Secondary | ICD-10-CM | POA: Diagnosis not present

## 2022-06-29 DIAGNOSIS — M542 Cervicalgia: Secondary | ICD-10-CM | POA: Diagnosis not present

## 2022-06-29 DIAGNOSIS — M21162 Varus deformity, not elsewhere classified, left knee: Secondary | ICD-10-CM | POA: Diagnosis not present

## 2022-06-29 DIAGNOSIS — M5134 Other intervertebral disc degeneration, thoracic region: Secondary | ICD-10-CM | POA: Diagnosis not present

## 2022-06-29 DIAGNOSIS — G589 Mononeuropathy, unspecified: Secondary | ICD-10-CM | POA: Diagnosis not present

## 2022-06-29 DIAGNOSIS — M4312 Spondylolisthesis, cervical region: Secondary | ICD-10-CM | POA: Diagnosis not present

## 2022-06-29 DIAGNOSIS — M5030 Other cervical disc degeneration, unspecified cervical region: Secondary | ICD-10-CM | POA: Diagnosis not present

## 2022-07-07 ENCOUNTER — Other Ambulatory Visit: Payer: Self-pay | Admitting: *Deleted

## 2022-07-07 MED ORDER — FUROSEMIDE 40 MG PO TABS: 40.0000 mg | ORAL_TABLET | Freq: Every day | ORAL | 3 refills | Status: AC

## 2022-07-09 ENCOUNTER — Other Ambulatory Visit: Payer: Self-pay | Admitting: Nurse Practitioner

## 2022-07-19 ENCOUNTER — Other Ambulatory Visit: Payer: Self-pay | Admitting: Internal Medicine

## 2022-07-26 DIAGNOSIS — G589 Mononeuropathy, unspecified: Secondary | ICD-10-CM | POA: Diagnosis not present

## 2022-08-03 ENCOUNTER — Other Ambulatory Visit: Payer: Self-pay | Admitting: Internal Medicine

## 2022-08-08 DIAGNOSIS — M25642 Stiffness of left hand, not elsewhere classified: Secondary | ICD-10-CM | POA: Diagnosis not present

## 2022-08-15 DIAGNOSIS — M25642 Stiffness of left hand, not elsewhere classified: Secondary | ICD-10-CM | POA: Diagnosis not present

## 2022-08-25 DIAGNOSIS — M25642 Stiffness of left hand, not elsewhere classified: Secondary | ICD-10-CM | POA: Diagnosis not present

## 2022-09-27 ENCOUNTER — Other Ambulatory Visit: Payer: Self-pay | Admitting: Internal Medicine

## 2022-09-27 ENCOUNTER — Other Ambulatory Visit: Payer: Self-pay

## 2022-09-27 MED ORDER — POTASSIUM CHLORIDE CRYS ER 10 MEQ PO TBCR
EXTENDED_RELEASE_TABLET | ORAL | 1 refills | Status: DC
Start: 2022-09-27 — End: 2022-10-03

## 2022-09-27 MED ORDER — POTASSIUM CHLORIDE CRYS ER 10 MEQ PO TBCR: EXTENDED_RELEASE_TABLET | ORAL | 1 refills | Status: DC

## 2022-10-02 ENCOUNTER — Other Ambulatory Visit: Payer: Self-pay | Admitting: Internal Medicine

## 2022-10-03 ENCOUNTER — Other Ambulatory Visit: Payer: Self-pay

## 2022-10-03 MED ORDER — POTASSIUM CHLORIDE CRYS ER 10 MEQ PO TBCR: EXTENDED_RELEASE_TABLET | ORAL | 3 refills | Status: DC

## 2022-10-03 MED ORDER — FINASTERIDE 5 MG PO TABS: 5.0000 mg | ORAL_TABLET | Freq: Every day | ORAL | 3 refills | Status: AC

## 2022-10-03 MED ORDER — METFORMIN HCL 500 MG PO TABS: ORAL_TABLET | ORAL | 3 refills | Status: AC

## 2022-10-03 MED ORDER — GLIMEPIRIDE 4 MG PO TABS: ORAL_TABLET | ORAL | 3 refills | Status: AC

## 2022-10-08 ENCOUNTER — Other Ambulatory Visit: Payer: Self-pay | Admitting: Internal Medicine

## 2022-10-08 MED ORDER — POTASSIUM CHLORIDE CRYS ER 10 MEQ PO TBCR: EXTENDED_RELEASE_TABLET | ORAL | 3 refills | Status: AC

## 2022-10-30 ENCOUNTER — Other Ambulatory Visit: Payer: Self-pay | Admitting: Internal Medicine

## 2022-11-16 DIAGNOSIS — G5632 Lesion of radial nerve, left upper limb: Secondary | ICD-10-CM | POA: Diagnosis not present

## 2023-02-20 IMAGING — DX DG CHEST 1V PORT
1 series · 1 of 1 positions shown · non-contrast
Comparison: None.

CLINICAL DATA: Intermittent chest pain

EXAM:
PORTABLE CHEST 1 VIEW

[chest ap]
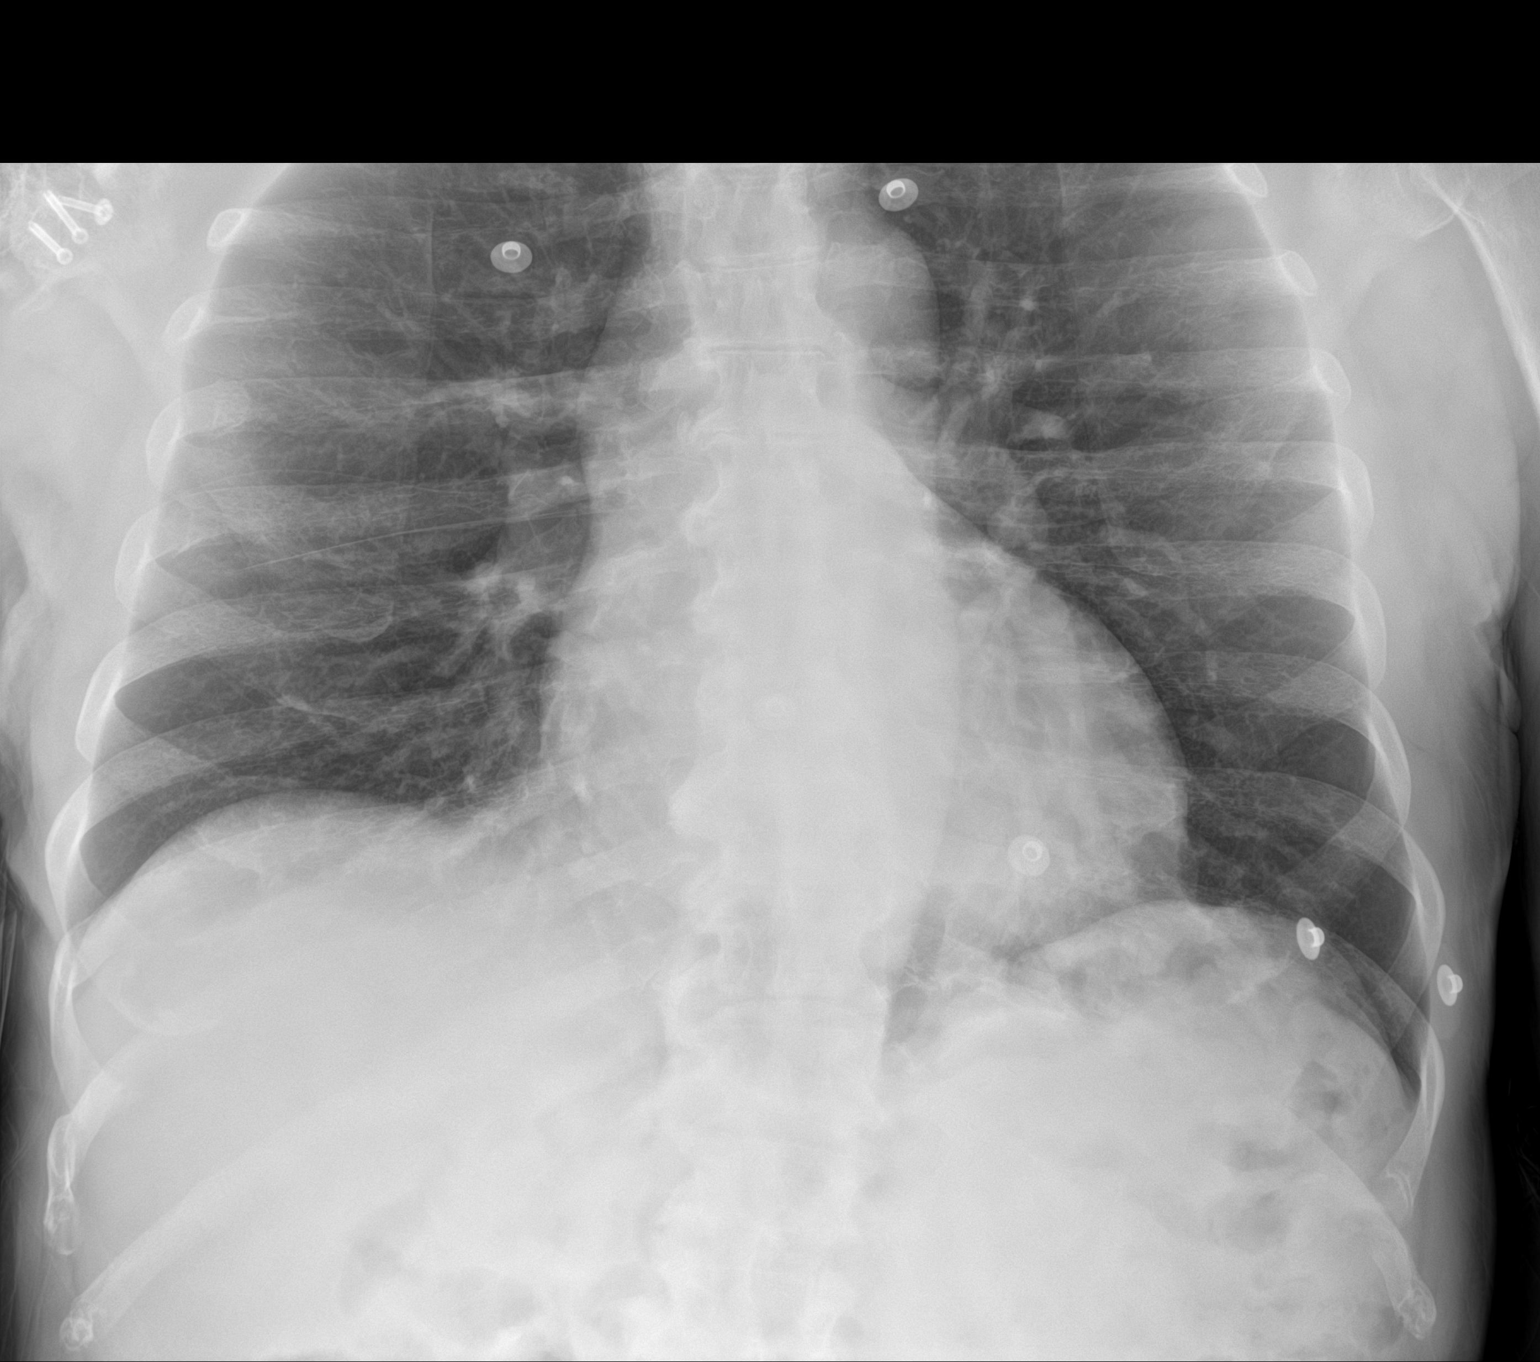

[1 of 1 positions shown; findings below may reference images not displayed]

FINDINGS: Heart size is normal. Mild tortuosity of the aorta. The lungs are
clear. The vascularity is normal. No effusions. Postsurgical changes
of the right shoulder.
IMPRESSION: No active disease.

## 2024-02-06 ENCOUNTER — Ambulatory Visit: Payer: Self-pay | Admitting: Internal Medicine

## 2024-03-15 ENCOUNTER — Ambulatory Visit: Payer: Self-pay | Admitting: Physician Assistant
